# Patient Record
Sex: Female | Born: 1955 | Race: White | Hispanic: No | Marital: Single | Smoking: Former smoker
Health system: Southern US, Community
[De-identification: ages and names within clinical notes are randomized; demographics above are authoritative.]

## PROBLEM LIST (undated history)

## (undated) DIAGNOSIS — G629 Polyneuropathy, unspecified: Secondary | ICD-10-CM

## (undated) DIAGNOSIS — E7800 Pure hypercholesterolemia, unspecified: Secondary | ICD-10-CM

## (undated) DIAGNOSIS — K219 Gastro-esophageal reflux disease without esophagitis: Secondary | ICD-10-CM

## (undated) DIAGNOSIS — M069 Rheumatoid arthritis, unspecified: Secondary | ICD-10-CM

## (undated) DIAGNOSIS — M549 Dorsalgia, unspecified: Secondary | ICD-10-CM

## (undated) DIAGNOSIS — R011 Cardiac murmur, unspecified: Secondary | ICD-10-CM

## (undated) DIAGNOSIS — J302 Other seasonal allergic rhinitis: Secondary | ICD-10-CM

## (undated) HISTORY — PX: OTHER SURGICAL HISTORY: SHX169

## (undated) HISTORY — DX: Rheumatoid arthritis, unspecified: M06.9

## (undated) HISTORY — DX: Dorsalgia, unspecified: M54.9

## (undated) HISTORY — PX: TONSILLECTOMY: SUR1361

## (undated) HISTORY — PX: TUBAL LIGATION: SHX77

## (undated) HISTORY — PX: KNEE SURGERY: SHX244

## (undated) HISTORY — DX: Polyneuropathy, unspecified: G62.9

---

## 2000-11-12 ENCOUNTER — Emergency Department (HOSPITAL_COMMUNITY): Admission: EM | Admit: 2000-11-12 | Discharge: 2000-11-12 | Payer: Self-pay | Admitting: *Deleted

## 2001-04-29 ENCOUNTER — Ambulatory Visit (HOSPITAL_COMMUNITY): Admission: RE | Admit: 2001-04-29 | Discharge: 2001-04-29 | Payer: Self-pay | Admitting: Obstetrics and Gynecology

## 2001-04-29 ENCOUNTER — Encounter: Payer: Self-pay | Admitting: Obstetrics and Gynecology

## 2001-05-13 ENCOUNTER — Ambulatory Visit (HOSPITAL_COMMUNITY): Admission: RE | Admit: 2001-05-13 | Discharge: 2001-05-13 | Payer: Self-pay | Admitting: Obstetrics and Gynecology

## 2001-05-13 ENCOUNTER — Encounter: Payer: Self-pay | Admitting: Obstetrics and Gynecology

## 2004-01-02 ENCOUNTER — Ambulatory Visit (HOSPITAL_COMMUNITY): Admission: RE | Admit: 2004-01-02 | Discharge: 2004-01-02 | Payer: Self-pay | Admitting: Preventative Medicine

## 2004-03-01 ENCOUNTER — Emergency Department (HOSPITAL_COMMUNITY): Admission: EM | Admit: 2004-03-01 | Discharge: 2004-03-01 | Payer: Self-pay | Admitting: Emergency Medicine

## 2004-03-03 ENCOUNTER — Emergency Department (HOSPITAL_COMMUNITY): Admission: EM | Admit: 2004-03-03 | Discharge: 2004-03-03 | Payer: Self-pay | Admitting: Emergency Medicine

## 2004-03-05 ENCOUNTER — Emergency Department (HOSPITAL_COMMUNITY): Admission: EM | Admit: 2004-03-05 | Discharge: 2004-03-05 | Payer: Self-pay | Admitting: Emergency Medicine

## 2004-03-31 ENCOUNTER — Emergency Department (HOSPITAL_COMMUNITY): Admission: EM | Admit: 2004-03-31 | Discharge: 2004-03-31 | Payer: Self-pay | Admitting: Emergency Medicine

## 2005-01-31 ENCOUNTER — Emergency Department (HOSPITAL_COMMUNITY): Admission: EM | Admit: 2005-01-31 | Discharge: 2005-01-31 | Payer: Self-pay | Admitting: Emergency Medicine

## 2005-02-01 ENCOUNTER — Emergency Department (HOSPITAL_COMMUNITY): Admission: EM | Admit: 2005-02-01 | Discharge: 2005-02-01 | Payer: Self-pay | Admitting: Emergency Medicine

## 2005-09-20 ENCOUNTER — Emergency Department (HOSPITAL_COMMUNITY): Admission: EM | Admit: 2005-09-20 | Discharge: 2005-09-20 | Payer: Self-pay | Admitting: Emergency Medicine

## 2005-10-14 ENCOUNTER — Ambulatory Visit (HOSPITAL_COMMUNITY): Admission: RE | Admit: 2005-10-14 | Discharge: 2005-10-14 | Payer: Self-pay | Admitting: Orthopaedic Surgery

## 2005-11-02 ENCOUNTER — Ambulatory Visit: Payer: Self-pay | Admitting: Orthopedic Surgery

## 2005-11-05 ENCOUNTER — Encounter (HOSPITAL_COMMUNITY): Admission: RE | Admit: 2005-11-05 | Discharge: 2005-11-14 | Payer: Self-pay | Admitting: Orthopedic Surgery

## 2005-11-16 ENCOUNTER — Encounter (HOSPITAL_COMMUNITY): Admission: RE | Admit: 2005-11-16 | Discharge: 2005-12-16 | Payer: Self-pay | Admitting: Orthopedic Surgery

## 2005-12-22 ENCOUNTER — Encounter (HOSPITAL_COMMUNITY): Admission: RE | Admit: 2005-12-22 | Discharge: 2006-01-21 | Payer: Self-pay | Admitting: Orthopedic Surgery

## 2006-01-04 ENCOUNTER — Ambulatory Visit: Payer: Self-pay | Admitting: Orthopedic Surgery

## 2006-02-01 ENCOUNTER — Ambulatory Visit: Payer: Self-pay | Admitting: Orthopedic Surgery

## 2006-03-15 ENCOUNTER — Ambulatory Visit: Payer: Self-pay | Admitting: Orthopedic Surgery

## 2006-06-14 ENCOUNTER — Ambulatory Visit: Payer: Self-pay | Admitting: Orthopedic Surgery

## 2006-10-21 ENCOUNTER — Ambulatory Visit (HOSPITAL_COMMUNITY): Admission: RE | Admit: 2006-10-21 | Discharge: 2006-10-21 | Payer: Self-pay | Admitting: Orthopedic Surgery

## 2006-11-03 ENCOUNTER — Ambulatory Visit (HOSPITAL_COMMUNITY): Admission: RE | Admit: 2006-11-03 | Discharge: 2006-11-03 | Payer: Self-pay | Admitting: Family Medicine

## 2006-12-06 ENCOUNTER — Encounter (HOSPITAL_COMMUNITY): Admission: RE | Admit: 2006-12-06 | Discharge: 2007-01-05 | Payer: Self-pay | Admitting: Orthopedic Surgery

## 2007-01-06 ENCOUNTER — Encounter (HOSPITAL_COMMUNITY): Admission: RE | Admit: 2007-01-06 | Discharge: 2007-02-05 | Payer: Self-pay | Admitting: Orthopedic Surgery

## 2007-02-14 ENCOUNTER — Ambulatory Visit (HOSPITAL_COMMUNITY): Admission: RE | Admit: 2007-02-14 | Discharge: 2007-02-14 | Payer: Self-pay | Admitting: Orthopedic Surgery

## 2007-02-25 ENCOUNTER — Encounter (HOSPITAL_COMMUNITY): Admission: RE | Admit: 2007-02-25 | Discharge: 2007-03-27 | Payer: Self-pay | Admitting: Orthopedic Surgery

## 2007-04-01 ENCOUNTER — Ambulatory Visit: Admission: RE | Admit: 2007-04-01 | Discharge: 2007-04-01 | Payer: Self-pay | Admitting: Orthopedic Surgery

## 2007-06-16 ENCOUNTER — Emergency Department (HOSPITAL_COMMUNITY): Admission: EM | Admit: 2007-06-16 | Discharge: 2007-06-16 | Payer: Self-pay | Admitting: Emergency Medicine

## 2007-08-22 ENCOUNTER — Ambulatory Visit (HOSPITAL_COMMUNITY): Admission: RE | Admit: 2007-08-22 | Discharge: 2007-08-22 | Payer: Self-pay | Admitting: Family Medicine

## 2008-05-14 ENCOUNTER — Encounter: Payer: Self-pay | Admitting: Gastroenterology

## 2008-07-02 ENCOUNTER — Telehealth (INDEPENDENT_AMBULATORY_CARE_PROVIDER_SITE_OTHER): Payer: Self-pay | Admitting: *Deleted

## 2008-08-24 ENCOUNTER — Ambulatory Visit (HOSPITAL_COMMUNITY): Admission: RE | Admit: 2008-08-24 | Discharge: 2008-08-24 | Payer: Self-pay | Admitting: Family Medicine

## 2008-09-14 DIAGNOSIS — I498 Other specified cardiac arrhythmias: Secondary | ICD-10-CM

## 2008-09-14 DIAGNOSIS — I10 Essential (primary) hypertension: Secondary | ICD-10-CM | POA: Insufficient documentation

## 2008-10-02 ENCOUNTER — Emergency Department (HOSPITAL_COMMUNITY): Admission: EM | Admit: 2008-10-02 | Discharge: 2008-10-02 | Payer: Self-pay | Admitting: Emergency Medicine

## 2009-08-26 ENCOUNTER — Ambulatory Visit (HOSPITAL_COMMUNITY): Admission: RE | Admit: 2009-08-26 | Discharge: 2009-08-26 | Payer: Self-pay | Admitting: Family Medicine

## 2010-01-24 ENCOUNTER — Ambulatory Visit (HOSPITAL_COMMUNITY)
Admission: RE | Admit: 2010-01-24 | Discharge: 2010-01-24 | Payer: Self-pay | Source: Home / Self Care | Attending: Family Medicine | Admitting: Family Medicine

## 2010-03-14 ENCOUNTER — Encounter
Admission: RE | Admit: 2010-03-14 | Discharge: 2010-03-14 | Payer: Self-pay | Source: Home / Self Care | Attending: Family Medicine | Admitting: Family Medicine

## 2010-04-25 ENCOUNTER — Other Ambulatory Visit: Payer: Self-pay | Admitting: Family Medicine

## 2010-04-25 DIAGNOSIS — M4716 Other spondylosis with myelopathy, lumbar region: Secondary | ICD-10-CM

## 2010-04-29 ENCOUNTER — Ambulatory Visit
Admission: RE | Admit: 2010-04-29 | Discharge: 2010-04-29 | Disposition: A | Discharge status: Home / Self Care | Payer: Medicare Other | Source: Ambulatory Visit | Attending: Family Medicine | Admitting: Family Medicine

## 2010-04-29 ENCOUNTER — Other Ambulatory Visit: Payer: Self-pay | Admitting: Family Medicine

## 2010-04-29 DIAGNOSIS — M4716 Other spondylosis with myelopathy, lumbar region: Secondary | ICD-10-CM

## 2010-05-20 ENCOUNTER — Other Ambulatory Visit: Payer: Medicare Other

## 2010-06-03 ENCOUNTER — Ambulatory Visit
Admission: RE | Admit: 2010-06-03 | Discharge: 2010-06-03 | Disposition: A | Discharge status: Home / Self Care | Payer: Medicare Other | Source: Ambulatory Visit | Attending: Family Medicine | Admitting: Family Medicine

## 2010-06-03 DIAGNOSIS — M4716 Other spondylosis with myelopathy, lumbar region: Secondary | ICD-10-CM

## 2010-07-01 NOTE — Op Note (Signed)
Mackenzie Black, Mackenzie Black              ACCOUNT NO.:  [id number]   MEDICAL RECORD NO.:  [id number]          PATIENT TYPE:  AMB   LOCATION:  SDS                          FACILITY:  [facility]   PHYSICIAN:  Vania Rea. Supple, M.D.  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   POSTOPERATIVE DIAGNOSES:  1. 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  2. 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   PROCEDURES:  1. Right knee examination under anesthesia.  2. Right knee diagnostic arthroscopy.  3. Autograft anterior cruciate ligament reconstruction the patellar      tendon bone autograft.  4. Chondroplasty of the medial femoral condyle and lateral tibial      plateaus.   SURGEON:  Vania Rea. Supple, M.D.   Threasa HeadsFrench Ana A. Shuford, P.A.-C.   ANESTHESIA:  General endotracheal as well as a femoral nerve block.   TOURNIQUET TIME:  Less than 90 minutes.   ESTIMATED BLOOD LOSS:  Minimal.   DRAINS:  None.   HISTORY:  [name] is a 55 year old female who has had chronic right  knee pain and symptomatic instability secondary to an ACL insufficiency.  Clinical exam as well as MRI scan confirm ACL insufficiency.  Due to her  ongoing pain and functional limitations, she is brought to the operating  room at this time for a planned ACL reconstruction.   Preoperatively counseled [name] on treatment options as well as  risks versus benefits thereof.  Possible surgical complications  including infection, neurovascular injury, DVT, PE arthrofibrosis,  persistence of pain, loss of motion, recurrence of instability and  possible need for additional surgery were all reviewed.  She understands  and accepts and agrees with our  planned procedure.   PROCEDURE IN DETAIL:  After undergoing routine preoperative evaluation,  patient received prophylactic antibiotics.  A femoral nerve block was  established in the holding area by the anesthesia department, placed  supine on the operative table and underwent smooth induction of a  general endotracheal anesthesia.  Right leg examination under anesthesia  revealed positive Lachman, positive pivot shift, no varus or valgus  laxity, no posterior instability.  A tourniquet applied to the right  thigh and the right leg was then sterilely prepped and draped in  standard fashion.  The leg was exsanguinated with a tourniquet inflated  to 350 mmHg.  An anterior midline incision was then made from the  inferior pole of the patella to just medial to the tibial tubercle,  total length approximately 6 cm.  A skin flap was circumferentially  mobilized and then the paratenon was divided in the midline and the  central one-third of the infrapatellar tendon was harvested with a  double bladed 10 mm wide knife and then bone plugs were harvested from  the corresponding aspects of the patella and tibial tubercle utilizing  an oscillating saw.  The  graft was taken to the back table and fashion  fit to 10-mm tunnels.   At this point, standard arthroscopy portals were established and  diagnostic arthroscopy was performed.  The suprapatellar pouch and  gutters were clear.  The patellofemoral joint showed normal patellar  tracking and the articular surfaces were in excellent condition.  The  intercondylar notch confirmed previous rupture of the ACL with a  positive MT notch sign.  The remnant to the ACL had scarred to the  PCL.  This was dissected free and all the remnants of the ACL were then  removed.  Medially, there was broad grade 3 chondromalacia on the medial  femoral condyle, which was debrided to a stable cartilaginous base with  the shaver.  The medial meniscus was carefully probed,  inspected and  found to be stable.  Laterally, the meniscus was probed and stable.  There was diffuse grade 1 softening of the lateral tibial plateau, which  was also debrided with the shaver.   Attention turned to the intercondylar notch where an osteotome was used  to initiate a notch plasty and this was then completed back to the old  position with a bur.  All residual bony debris was meticulously removed.  A tibial guide was then placed and the guide pin brought up through the  footprint of the ACL from the proximal medial tibia and this was then  overdrilled with the 10-mm reamer.  The femoral guide was then passed.  A guide pin placed up into the distal femur at the 10:30 position and  this was then overdrilled with the reamer.  Of note, at this point, we  did find that the bone was extremely osteoporotic and the reamers  readily passed through the bone with very little resistance.   At this point, the tunnel was then cleaned.  The residual bony debris  was meticulously removed.  A two-pin passer was then directed to the  tibia and then the femoral tunnels and brought out through a stab wound  on the anterolateral thigh.  A flexible guide wire was then passed and  then the graft was pulled into position.  Good interference fit was  achieved proximally and distally.  We initially placed an 8 x 25 mm  biointerference screw into the femoral tunnel.  There was very little  resistance encountered as the screw was placed.  We took the knee  through range of motion and there was physiometric positioning of the  graft with no evidence for impingement.  We then placed an initial  tibial tunnel 9 mm x 25 mm biointerference screw.   At this point, we performed intraoperative Lachman and pivot shift and  the knee felt unstable.  We then reexamined the joint arthroscopically  and did see excess laxity in the graft.  We went ahead and removed the  tibial screw.  We then pulled on the graft and  the femoral bone plug  really dislodged from the femoral tunnel.  With this finding, we went  ahead and removed the initial 8 x 25 BioScrew from the femoral tunnel.  We repassed the graft and then we placed a guide wire, then initially  placed a 10 x 25 mm biointerference screw up into the femoral tunnel and  this again had very unimpressive bony purchase.  I then performed a  double stack, placing an additional 7-mm screw up into the femoral  tunnel.  This finally did obtain some fair bony purchase.  Once  this was  completed, then tensioning of the graft showed good stability.  We then  placed a combination of an 8 and a 9-mm BioScrew into the tibial tunnel,  again, in a double stack technique and, again, this did obtain good  purchase and stable fixation.  At this point, the intraoperative Lachman  was negative.  Graft was then inspected and probed and found to have  good tension and good position.  All fluid and instruments were then  removed.   The anterior incision was closed with a running 2-0 Vicryl through the  paratenon area, 2-0 Vicryl for the subcutaneous and intracuticular, 3-0  Monocryl for the skin, followed by Steri-Strips.  Proximal portals  closed with Steri-Strips.  A combination of Marcaine, morphine,  epinephrine and clonidine was instilled into the joint.  Additional Marcaine with epinephrine was instilled about the portals and  incision.  Bulky dry dressing was applied, followed by an Ace wrap,  thigh high sports stocking, knee immobilizer.  The tourniquet was then  let down and the patient was extubated and taken to the recovery room in  stable condition.      Vania Rea. Supple, M.D.  Electronically Signed     KMS/MEDQ  D:  10/21/2006  T:  10/22/2006  Job:  [id number]

## 2010-08-21 ENCOUNTER — Other Ambulatory Visit (HOSPITAL_COMMUNITY): Payer: Self-pay | Admitting: Family Medicine

## 2010-08-21 DIAGNOSIS — Z139 Encounter for screening, unspecified: Secondary | ICD-10-CM

## 2010-09-12 ENCOUNTER — Ambulatory Visit (HOSPITAL_COMMUNITY)
Admission: RE | Admit: 2010-09-12 | Discharge: 2010-09-12 | Disposition: A | Discharge status: Home / Self Care | Payer: Medicare Other | Source: Ambulatory Visit | Attending: Family Medicine | Admitting: Family Medicine

## 2010-09-12 DIAGNOSIS — Z1231 Encounter for screening mammogram for malignant neoplasm of breast: Secondary | ICD-10-CM | POA: Insufficient documentation

## 2010-09-12 DIAGNOSIS — Z139 Encounter for screening, unspecified: Secondary | ICD-10-CM

## 2010-09-22 ENCOUNTER — Other Ambulatory Visit: Payer: Self-pay | Admitting: Family Medicine

## 2010-09-22 DIAGNOSIS — M47816 Spondylosis without myelopathy or radiculopathy, lumbar region: Secondary | ICD-10-CM

## 2010-09-24 ENCOUNTER — Ambulatory Visit
Admission: RE | Admit: 2010-09-24 | Discharge: 2010-09-24 | Disposition: A | Discharge status: Home / Self Care | Payer: Medicare Other | Source: Ambulatory Visit | Attending: Family Medicine | Admitting: Family Medicine

## 2010-09-24 VITALS — BP 118/70 | HR 64

## 2010-09-24 DIAGNOSIS — M47816 Spondylosis without myelopathy or radiculopathy, lumbar region: Secondary | ICD-10-CM

## 2010-09-24 MED ORDER — METHYLPREDNISOLONE ACETATE 40 MG/ML INJ SUSP (RADIOLOG
120.0000 mg | Freq: Once | INTRAMUSCULAR | Status: AC
  Administered 2010-09-24: 120 mg via EPIDURAL

## 2010-09-24 MED ORDER — IOHEXOL 180 MG/ML  SOLN
1.0000 mL | Freq: Once | INTRAMUSCULAR | Status: AC | PRN
  Administered 2010-09-24: 1 mL via EPIDURAL

## 2010-09-24 NOTE — Progress Notes (Signed)
Patient with significant numbness LLE.  Will keep in recovery until better able to bear weight.  jkl

## 2010-11-11 LAB — DIFFERENTIAL
Eosinophils Absolute: 0.1
Eosinophils Relative: 1
Lymphs Abs: 2
Monocytes Absolute: 0.7
Monocytes Relative: 7

## 2010-11-11 LAB — CBC
HCT: 42.3
MCV: 89
RBC: 4.75
WBC: 9.9

## 2010-11-11 LAB — BASIC METABOLIC PANEL
CO2: 27
Chloride: 101
GFR calc Af Amer: >60
Potassium: 4.7

## 2010-11-28 LAB — CBC
MCHC: 33.7
MCV: 90
Platelets: 319
RDW: 12.6

## 2010-11-28 LAB — COMPREHENSIVE METABOLIC PANEL
AST: 30
Albumin: 4.4
CO2: 26
Calcium: 9.9
Creatinine, Ser: 0.62
GFR calc Af Amer: >60
GFR calc non Af Amer: >60

## 2010-11-28 LAB — URINALYSIS, ROUTINE W REFLEX MICROSCOPIC
Glucose, UA: NEGATIVE
Nitrite: NEGATIVE
Protein, ur: NEGATIVE
Urobilinogen, UA: 0.2

## 2010-11-28 LAB — URINE MICROSCOPIC-ADD ON

## 2010-11-28 LAB — PROTIME-INR: Prothrombin Time: 12.5

## 2010-11-28 LAB — DIFFERENTIAL
Eosinophils Relative: 1
Lymphocytes Relative: 19
Lymphs Abs: 2.2

## 2011-03-24 NOTE — H&P (Signed)
  NTS SOAP Note  Vital Signs:  Vitals as of: 03/24/2011: Systolic 154: Diastolic 92: Heart Rate 63: Temp 97.70F: Height 73ft 7in: Weight 154Lbs 0 Ounces: OFC 0in: Respiratory Rate 0: O2 Saturation 0: Pain Level 0: BMI 24  BMI : 24.12 kg/m2  Subjective: This 56 Years 76 Months old Female presents forscheduling colonoscopy.  Never has had a colonoscopy.  Denies GI complaints.  Sister and brother have colon cancer.  Review of Symptoms:  Constitutional:unremarkable Head:unremarkable Eyes:unremarkable Nose/Mouth/Throat:unremarkable Cardiovascular:unremarkable Respiratory:unremarkable Gastrointestinal:unremarkable Genitourinary:unremarkable Musculoskeletal:unremarkable Skin:unremarkable Hematolgic/Lymphatic:unremarkable Allergic/Immunologic:unremarkable   Past Medical History:Reviewed   Past Medical History  Surgical History: feet surgery Psychiatric History:  Depression Allergies: nkda Medications: loratadine, bupropion, gabapentin   Social History:Reviewed   Social History  Preferred Language: English ([place]) Race:  White Ethnicity: Not Hispanic / Latino Age: 56 Years 7 Months Marital Status:  S Alcohol:  No Recreational drug(s):  No   Smoking Status: Never smoker reviewed on 03/24/2011  Family History:Reviewed   Family History  Family h/o colon cancer in brother, sister    Objective Information: General:Well appearing, well nourished in no distress. Head:Atraumatic; no masses; no abnormalities Neck:Supple without lymphadenopathy.  Heart:RRR, no murmur or gallop.  Normal S1, S2.  No S3, S4.  Lungs:CTA bilaterally, no wheezes, rhonchi, rales.  Breathing unlabored. Abdomen:Soft, NT/ND, no HSM, no masses. deferred to procedure  Assessment:Family h/o colon cancer  Diagnosis &amp; Procedure: DiagnosisCode: V76.51, ProcedureCode: 16109,    Plan:Scheduled for TCS on  03/31/11.   Patient Education:Alternative treatments to surgery were discussed with patient (and family).Risks and benefits  of procedure were fully explained to the patient (and family) who gave informed consent. Patient/family questions were addressed.  Follow-up:Pending Surgery

## 2011-03-30 MED ORDER — SODIUM CHLORIDE 0.45 % IV SOLN
Freq: Once | INTRAVENOUS | Status: AC
  Administered 2011-03-31: 09:00:00 via INTRAVENOUS

## 2011-03-31 ENCOUNTER — Ambulatory Visit (HOSPITAL_COMMUNITY)
Admission: RE | Admit: 2011-03-31 | Discharge: 2011-03-31 | Disposition: A | Discharge status: Home / Self Care | Payer: Medicare Other | Source: Ambulatory Visit | Attending: General Surgery | Admitting: General Surgery

## 2011-03-31 ENCOUNTER — Encounter (HOSPITAL_COMMUNITY)
Admission: RE | Disposition: A | Discharge status: Home / Self Care | Payer: Self-pay | Source: Ambulatory Visit | Attending: General Surgery

## 2011-03-31 ENCOUNTER — Encounter (HOSPITAL_COMMUNITY): Payer: Self-pay | Admitting: *Deleted

## 2011-03-31 DIAGNOSIS — Z1211 Encounter for screening for malignant neoplasm of colon: Secondary | ICD-10-CM | POA: Insufficient documentation

## 2011-03-31 HISTORY — PX: COLONOSCOPY: SHX5424

## 2011-03-31 SURGERY — COLONOSCOPY
Anesthesia: Moderate Sedation

## 2011-03-31 MED ORDER — MIDAZOLAM HCL 5 MG/5ML IJ SOLN
INTRAMUSCULAR | Status: DC | PRN
  Administered 2011-03-31: 3 mg via INTRAVENOUS
  Administered 2011-03-31 (×2): 1 mg via INTRAVENOUS

## 2011-03-31 MED ORDER — MIDAZOLAM HCL 5 MG/5ML IJ SOLN
INTRAMUSCULAR | Status: AC
  Filled 2011-03-31: qty 5

## 2011-03-31 MED ORDER — MEPERIDINE HCL 50 MG/ML IJ SOLN
INTRAMUSCULAR | Status: AC
  Filled 2011-03-31: qty 2

## 2011-03-31 MED ORDER — MEPERIDINE HCL 25 MG/ML IJ SOLN
INTRAMUSCULAR | Status: DC | PRN
  Administered 2011-03-31: 50 mg via INTRAVENOUS

## 2011-03-31 MED ORDER — STERILE WATER FOR IRRIGATION IR SOLN
Status: DC | PRN
  Administered 2011-03-31: 09:00:00

## 2011-03-31 NOTE — Op Note (Signed)
[facility] [address] [place], [place]  [address]  COLONOSCOPY PROCEDURE REPORT  PATIENT:  Mackenzie Black, Mackenzie Black  MR#:  [id number] BIRTHDATE:  [birth year], 55 yrs. old  GENDER:  female ENDOSCOPIST:  Franky Macho, MD REF. BY:  Gareth Morgan, M.D. PROCEDURE DATE:  03/31/2011 PROCEDURE:  Average-risk screening colonoscopy G0121 ASA CLASS:  Class II INDICATIONS:  Screening MEDICATIONS:   Versed 5 mg IV, demerol 50 mg IV  DESCRIPTION OF PROCEDURE:   After the risks benefits and alternatives of the procedure were thoroughly explained, informed consent was obtained.  Digital rectal exam was performed and revealed no abnormalities.   The EC-[id number] ([id number]) endoscope was introduced through the anus and advanced to the cecum, which was identified by both the appendix and ileocecal valve, without limitations.  The quality of the prep was fair..  The instrument was then slowly withdrawn as the colon was fully examined.  FINDINGS:  Tortuous sigmoid colon noted.  A normal appearing cecum, ileocecal valve, and appendiceal orifice were identified. The ascending, hepatic flexure, transverse, splenic flexure, descending, sigmoid colon, and rectum appeared unremarkable. Retroflexed views in the rectum revealed it was not tolerated by the patient.  The scope was then withdrawn from the cecum and the procedure completed. COMPLICATIONS:  None ENDOSCOPIC IMPRESSION: 1) Normal colon  RECOMMENDATIONS:  REPEAT EXAM:  In 10 year(s) for Colonoscopy.  ______________________________ Franky Macho, MD  CC:  Gareth Morgan MD  n. Rosalie DoctorFranky Macho at 03/31/2011 09:33 AM  Maggie Font, [phone]

## 2011-03-31 NOTE — Interval H&P Note (Signed)
History and Physical Interval Note:  03/31/2011 8:56 AM  Mackenzie Black  has presented today for surgery, with the diagnosis of Special screening for malignant neoplasms, colon  The various methods of treatment have been discussed with the patient and family. After consideration of risks, benefits and other options for treatment, the patient has consented to  Procedure(s) (LRB): COLONOSCOPY (N/A) as a surgical intervention .  The patients' history has been reviewed, patient examined, no change in status, stable for surgery.  I have reviewed the patients' chart and labs.  Questions were answered to the patient's satisfaction.     Franky Macho A

## 2011-03-31 NOTE — Discharge Instructions (Signed)
Colonoscopy  Care After  Read the instructions outlined below and refer to this sheet in the next few weeks. These discharge instructions provide you with general information on caring for yourself after you leave the hospital. Your doctor may also give you specific instructions. While your treatment has been planned according to the most current medical practices available, unavoidable complications occasionally occur. If you have any problems or questions after discharge, call your doctor.  HOME CARE INSTRUCTIONS  ACTIVITY:  · You may resume your regular activity, but move at a slower pace for the next 24 hours.   · Take frequent rest periods for the next 24 hours.   · Walking will help get rid of the air and reduce the bloated feeling in your belly (abdomen).   · No driving for 24 hours (because of the medicine (anesthesia) used during the test).   · You may shower.   · Do not sign any important legal documents or operate any machinery for 24 hours (because of the anesthesia used during the test).   NUTRITION:  · Drink plenty of fluids.   · You may resume your normal diet as instructed by your doctor.   · Begin with a light meal and progress to your normal diet. Heavy or fried foods are harder to digest and may make you feel sick to your stomach (nauseated).   · Avoid alcoholic beverages for 24 hours or as instructed.   MEDICATIONS:  · You may resume your normal medications unless your doctor tells you otherwise.   WHAT TO EXPECT TODAY:  · Some feelings of bloating in the abdomen.   · Passage of more gas than usual.   · Spotting of blood in your stool or on the toilet paper.   IF YOU HAD POLYPS REMOVED DURING THE COLONOSCOPY:  · No aspirin products for 7 days or as instructed.   · No alcohol for 7 days or as instructed.   · Eat a soft diet for the next 24 hours.   FINDING OUT THE RESULTS OF YOUR TEST  Not all test results are available during your visit. If your test results are not back during the visit, make an  appointment with your caregiver to find out the results. Do not assume everything is normal if you have not heard from your caregiver or the medical facility. It is important for you to follow up on all of your test results.   SEEK IMMEDIATE MEDICAL CARE IF:  · You have more than a spotting of blood in your stool.   · Your belly is swollen (abdominal distention).   · You are nauseated or vomiting.   · You have a fever.   · You have abdominal pain or discomfort that is severe or gets worse throughout the day.   Document Released: 09/17/2003 Document Revised: 10/15/2010 Document Reviewed: 09/15/2007  ExitCare® Patient Information ©2012 [employer].

## 2011-04-06 ENCOUNTER — Encounter (HOSPITAL_COMMUNITY): Payer: Self-pay | Admitting: General Surgery

## 2011-05-11 ENCOUNTER — Other Ambulatory Visit: Payer: Self-pay | Admitting: Neurosurgery

## 2011-05-11 DIAGNOSIS — M79606 Pain in leg, unspecified: Secondary | ICD-10-CM

## 2011-05-11 DIAGNOSIS — M545 Low back pain: Secondary | ICD-10-CM

## 2011-05-11 DIAGNOSIS — M47816 Spondylosis without myelopathy or radiculopathy, lumbar region: Secondary | ICD-10-CM

## 2011-05-19 ENCOUNTER — Other Ambulatory Visit: Payer: Medicare Other

## 2011-05-26 ENCOUNTER — Ambulatory Visit
Admission: RE | Admit: 2011-05-26 | Discharge: 2011-05-26 | Disposition: A | Discharge status: Home / Self Care | Payer: Medicare Other | Source: Ambulatory Visit | Attending: Neurosurgery | Admitting: Neurosurgery

## 2011-05-26 DIAGNOSIS — M47816 Spondylosis without myelopathy or radiculopathy, lumbar region: Secondary | ICD-10-CM

## 2011-05-26 DIAGNOSIS — M79606 Pain in leg, unspecified: Secondary | ICD-10-CM

## 2011-05-26 DIAGNOSIS — M545 Low back pain: Secondary | ICD-10-CM

## 2011-06-15 ENCOUNTER — Ambulatory Visit (HOSPITAL_COMMUNITY)
Admission: RE | Admit: 2011-06-15 | Discharge: 2011-06-15 | Disposition: A | Discharge status: Home / Self Care | Payer: Medicare Other | Source: Ambulatory Visit | Attending: Neurosurgery | Admitting: Neurosurgery

## 2011-06-15 DIAGNOSIS — M545 Low back pain, unspecified: Secondary | ICD-10-CM | POA: Insufficient documentation

## 2011-06-15 DIAGNOSIS — M549 Dorsalgia, unspecified: Secondary | ICD-10-CM | POA: Insufficient documentation

## 2011-06-15 DIAGNOSIS — IMO0001 Reserved for inherently not codable concepts without codable children: Secondary | ICD-10-CM | POA: Insufficient documentation

## 2011-06-15 DIAGNOSIS — M6281 Muscle weakness (generalized): Secondary | ICD-10-CM | POA: Insufficient documentation

## 2011-06-15 DIAGNOSIS — M47816 Spondylosis without myelopathy or radiculopathy, lumbar region: Secondary | ICD-10-CM | POA: Insufficient documentation
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
## 2011-06-17 ENCOUNTER — Inpatient Hospital Stay (HOSPITAL_COMMUNITY): Admission: RE | Admit: 2011-06-17 | Payer: Medicare Other | Source: Ambulatory Visit | Admitting: Physical Therapy

## 2011-06-17 ENCOUNTER — Telehealth (HOSPITAL_COMMUNITY): Payer: Self-pay

## 2011-06-18 ENCOUNTER — Ambulatory Visit (HOSPITAL_COMMUNITY)
Admission: RE | Admit: 2011-06-18 | Discharge: 2011-06-18 | Disposition: A | Discharge status: Home / Self Care | Payer: Medicare Other | Source: Ambulatory Visit | Attending: Neurosurgery | Admitting: Neurosurgery

## 2011-06-18 ENCOUNTER — Ambulatory Visit (HOSPITAL_COMMUNITY)
Admission: RE | Admit: 2011-06-18 | Discharge: 2011-06-18 | Disposition: A | Discharge status: Home / Self Care | Payer: Medicare Other | Source: Ambulatory Visit | Attending: Family Medicine | Admitting: Family Medicine

## 2011-06-18 ENCOUNTER — Other Ambulatory Visit (HOSPITAL_COMMUNITY): Payer: Self-pay | Admitting: Family Medicine

## 2011-06-18 DIAGNOSIS — I10 Essential (primary) hypertension: Secondary | ICD-10-CM | POA: Insufficient documentation

## 2011-06-18 DIAGNOSIS — R011 Cardiac murmur, unspecified: Secondary | ICD-10-CM

## 2011-06-18 DIAGNOSIS — I059 Rheumatic mitral valve disease, unspecified: Secondary | ICD-10-CM

## 2011-06-18 NOTE — Progress Notes (Signed)
*  PRELIMINARY RESULTS* Echocardiogram 2D Echocardiogram has been performed.  Conrad Pipestone 06/18/2011, 11:51 AM

## 2011-06-19 ENCOUNTER — Ambulatory Visit (HOSPITAL_COMMUNITY): Payer: Medicare Other | Admitting: Physical Therapy

## 2011-06-22 ENCOUNTER — Ambulatory Visit (HOSPITAL_COMMUNITY)
Admission: RE | Admit: 2011-06-22 | Discharge: 2011-06-22 | Disposition: A | Discharge status: Home / Self Care | Payer: Medicare Other | Source: Ambulatory Visit | Attending: Family Medicine | Admitting: Family Medicine

## 2011-06-22 DIAGNOSIS — IMO0001 Reserved for inherently not codable concepts without codable children: Secondary | ICD-10-CM | POA: Insufficient documentation

## 2011-06-22 DIAGNOSIS — M545 Low back pain, unspecified: Secondary | ICD-10-CM | POA: Insufficient documentation

## 2011-06-22 DIAGNOSIS — M6281 Muscle weakness (generalized): Secondary | ICD-10-CM | POA: Insufficient documentation

## 2011-06-22 NOTE — Progress Notes (Signed)
Physical Therapy Treatment Patient Details  Name: Mackenzie Black MRN: [id number] Date of Birth: 08/01/1955  Today's Date: 06/22/2011 Time: 1302-1345 PT Time Calculation (min): 43 min Charges: 65' NMR Visit#: 2  of 12   Re-eval: 07/15/11    Authorization: MEDICARE  Authorization Time Period: G Code: CL, Goal: CJ  Authorization Visit#: 2  of 10    Subjective: Symptoms/Limitations Symptoms: Pt reports that her allergies have started to act up the last few days and she was unable to complete her exercises.  Pain Assessment Currently in Pain?: Yes Pain Score:   5 Pain Location: Back  Exercise/Treatments Supine Ab Set: Limitations AB Set Limitations: NMR using TC's, VC's and visual cueing to activate.  Able to activate L side 2x10 sec hold after NMR Other Supine Lumbar Exercises: NMR to establish PF contraction using TC's, VC's and Visual cueing, unable to palpate appropriate TrA contraction Prone  Other Prone Lumbar Exercises: Diaphragmatic breathing w/NMR for proper techniques used throughout the session for TrA, PF and Mult contractions.  Other Prone Lumbar Exercises: NMR for multifidus activiation using VC's, TC's, Visual cueing and L arm lift for activiation 3x10 sec holds w/arm lift  Physical Therapy Assessment and Plan PT Assessment and Plan Clinical Impression Statement: Treatment focus on NMR to core musculature.  Continues to have significant substitution methods including holding breath and recruiting prime movers.  She has most success activating multifidus muscles w/arm lift cueing.  OVerall able to palpate signficant weakness to R: multifids and TrA  musculature.  Reports pain 3/10 at end of treatment PT Plan: Cont with NMR for core muscles, Add heel/toe roll in and outs as able, SKTC, bridging when able.     Goals    Problem List Patient Active Problem List  Diagnoses  . HYPERTENSION  . SUPRAVENTRICULAR TACHYCARDIA  . Lumbar spondylosis  . Back pain        GP No functional reporting required  Mackenzie Black 06/22/2011, 1:55 PM

## 2011-06-24 ENCOUNTER — Ambulatory Visit (HOSPITAL_COMMUNITY)
Admission: RE | Admit: 2011-06-24 | Discharge: 2011-06-24 | Disposition: A | Discharge status: Home / Self Care | Payer: Medicare Other | Source: Ambulatory Visit | Attending: Family Medicine | Admitting: Family Medicine

## 2011-06-24 NOTE — Progress Notes (Signed)
Physical Therapy Treatment Patient Details  Name: Mackenzie Black MRN: [id number] Date of Birth: 1955/10/28  Today's Date: 06/24/2011 Time: 1301-1359 PT Time Calculation (min): 58 min Charges: 1 e-stim, 30' NMR, 10 TE Visit#: 3  of 12   Re-eval: 07/15/11    Authorization: MEDICARE   Authorization Time Period: G Code: CL, Goal: CJ  Authorization Visit#: 3  of 10    Subjective: Symptoms/Limitations Symptoms: Pt reports that her back is feeling really good, it is her feet that are giving her the most trouble.  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  Pain Assessment Currently in Pain?: Yes Pain Score: 10-Worst pain ever Pain Location: Foot Pain Orientation: Right;Left Pain Type: Chronic pain  Precautions/Restrictions     Exercise/Treatments Stretches Single Knee to Chest Stretch: 5 reps;10 seconds (BLE) Lower Trunk Rotation: 5 reps;Other (comment) (5 sec holds each direction) Seated Other Seated Lumbar Exercises: Heel/Toe Roll ins and outs 5x10 sec holds w/cueing for PF contraction Supine Ab Set: Other (comment) (7 reps, 10 sec holds) AB Set Limitations: NMR to establish using Diaphragmatic breathing techniques Bridge: 10 reps;5 seconds Prone  Other Prone Lumbar Exercises: NMR to establish R multifidus activation using TC's and VC's, able to complete 4x10 sec after NMR; NMR to PF via Multifidus palpation 3x10 sec holds  Physical Therapy Assessment and Plan PT Assessment and Plan Clinical Impression Statement: Pt had significant improvement in NMR to core musculature today and required less TC's, VC's and subsitiution activities to activate Multifidus and TrA.  Continues to demonstrate increased weakness to her PF muscles, however had improved NM control to them.   She reports increased fatigue after treatment today to her core muscles.  PT Plan: Cont w/NMR to core muscles and progress as able.  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 HYPERTENSION  . SUPRAVENTRICULAR TACHYCARDIA  . Lumbar spondylosis  . Back pain       GP No functional reporting required  Mackenzie Black 06/24/2011, 1:48 PM

## 2011-06-25 ENCOUNTER — Ambulatory Visit (HOSPITAL_COMMUNITY)
Admission: RE | Admit: 2011-06-25 | Discharge: 2011-06-25 | Disposition: A | Discharge status: Home / Self Care | Payer: Medicare Other | Source: Ambulatory Visit | Attending: Family Medicine | Admitting: Family Medicine

## 2011-06-25 NOTE — Progress Notes (Signed)
Physical Therapy Treatment Patient Details  Name: Mackenzie Black MRN: [id number] Date of Birth: 12-26-1955  Today's Date: 06/25/2011 Time: 9147-8295 PT Time Calculation (min): 56 min Visit#: 4  of 12   Re-eval: 07/15/11 Charges: Therex x 33' Estim x 15'   Authorization: MEDICARE  Authorization Time Period: G Code: CL, Goal: CJ   Authorization Visit#: 4  of 10    Subjective: Symptoms/Limitations Symptoms: Pt states that she was pain free after last session until later in the evening. Pain Assessment Currently in Pain?: Yes Pain Score:   4 Pain Location: Back Pain Orientation: Right;Left;Lower   Exercise/Treatments Stretches Single Knee to Chest Stretch: 5 reps;10 seconds Lower Trunk Rotation: 5 reps;10 seconds Supine Ab Set: 10 reps;Other (comment) (10" holds) AB Set Limitations: NMR to establish using Diaphragmatic breathing techniques Bent Knee Raise: 5 reps Bridge: 10 reps;5 seconds  Modalities Modalities: Archivist Stimulation Location: Lumbar/scaral area Statistician Action: IFC Electrical Stimulation Parameters: IFC L12 x 15' Electrical Stimulation Goals: Pain  Physical Therapy Assessment and Plan PT Assessment and Plan Clinical Impression Statement: Pt presents with improved multifidus contraction. Pt requires multimodal cueing to stabilize core with bent knee raises. IFC completed again this session secondary to positive results last session. Pt reports pain decrease to 2/10 at end of session. PT Plan: Continue to progress core stability/ decrease pain per PT POC.     Problem List Patient Active Problem List  Diagnoses  . HYPERTENSION  . SUPRAVENTRICULAR TACHYCARDIA  . Lumbar spondylosis  . Back pain    PT - End of Session Activity Tolerance: Patient tolerated treatment well General Behavior During Session: [facility] for tasks performed Cognition: [facility] for tasks performed   Seth Bake,  PTA 06/25/2011, 3:43 PM

## 2011-06-26 ENCOUNTER — Ambulatory Visit (HOSPITAL_COMMUNITY): Payer: Medicare Other

## 2011-06-29 ENCOUNTER — Ambulatory Visit (HOSPITAL_COMMUNITY)
Admission: RE | Admit: 2011-06-29 | Discharge: 2011-06-29 | Disposition: A | Discharge status: Home / Self Care | Payer: Medicare Other | Source: Ambulatory Visit | Attending: Family Medicine | Admitting: Family Medicine

## 2011-06-29 NOTE — Progress Notes (Signed)
Physical Therapy Treatment Patient Details  Name: Mackenzie Black MRN: [id number] Date of Birth: 06-19-1955  Today's Date: 06/29/2011 Time: 4098-1191 PT Time Calculation (min): 42 min Visit#: 5  of 12   Re-eval: 07/15/11 Charges:  therex 25', IFES (estim unattended)  X 15'    Subjective: Symptoms/Limitations Symptoms: Pt. states her pain continues to decrease.   Pain Assessment Currently in Pain?: Yes Pain Score:   2 Pain Location: Back Pain Orientation: Right;Left;Lower   Exercise/Treatments Stretches Single Knee to Chest Stretch: 5 reps;10 seconds Lower Trunk Rotation: 5 reps;10 seconds Supine Ab Set: 15 reps Bent Knee Raise: 10 reps Bridge: 15 reps Sidelying Clam: 5 reps;Limitations Clam Limitations: 10 sec hold each Prone  Single Arm Raise: 5 reps Straight Leg Raise: 5 reps   Electrical Stimulation Electrical Stimulation Location: B Lumbar/sacral area Electrical Stimulation Action: IFES to decrease pain Electrical Stimulation Parameters: 9.5 Volts X 15' in prone position Electrical Stimulation Goals: Pain  Physical Therapy Assessment and Plan PT Assessment and Plan Clinical Impression Statement: Progressed stabilization exercises with prone SAR and SLR, side lying clams.  Pt. required VCs for form but with good stabilization. Able to increase reps of remaining exercises. PT Plan: Continue to progress stab and decrease pain.     Problem List Patient Active Problem List  Diagnoses  . HYPERTENSION  . SUPRAVENTRICULAR TACHYCARDIA  . Lumbar spondylosis  . Back pain    PT - End of Session Activity Tolerance: Patient tolerated treatment well General Behavior During Session: [facility] for tasks performed Cognition: [facility] for tasks performed   Angelie Kram B. Bascom Levels, PTA 06/29/2011, 1:41 PM

## 2011-07-01 ENCOUNTER — Ambulatory Visit (HOSPITAL_COMMUNITY)
Admission: RE | Admit: 2011-07-01 | Discharge: 2011-07-01 | Disposition: A | Discharge status: Home / Self Care | Payer: Medicare Other | Source: Ambulatory Visit | Attending: Family Medicine | Admitting: Family Medicine

## 2011-07-01 NOTE — Progress Notes (Signed)
Physical Therapy Treatment Patient Details  Name: Mackenzie Black MRN: [id number] Date of Birth: Jun 18, 1955  Today's Date: 07/01/2011 Time: 1312-1400 PT Time Calculation (min): 48 min  Visit#: 6  of 12   Re-eval: 07/15/11  Charge: therex 38 min estim 10 min  Authorization: MEDICARE  Authorization Time Period: G Code: CL, Goal: CJ   Authorization Visit#: 6  of 10    Subjective: Symptoms/Limitations Symptoms: Pt stated LBP not bad today, pain scale for lower back l>R 3/10.  My feet are killing me today, 10/10 pain. Pain Assessment Currently in Pain?: Yes Pain Score:   3 Pain Location: Back Pain Orientation: Left;Lower  Objective:   Exercise/Treatments Stretches Single Knee to Chest Stretch: 5 reps;10 seconds Lower Trunk Rotation: 5 reps;10 seconds Supine Ab Set: 15 reps AB Set Limitations: NMR for PFC and TrA with diaphragmatic breathing Bridge: 15 reps Sidelying Clam: 10 reps;Limitations Clam Limitations: 10 sec hold each Prone  Single Arm Raise: Limitations Single Arm Raises Limitations: time Straight Leg Raise: Limitations Straight Leg Raises Limitations: time  Modalities Modalities: Archivist Stimulation Location: B Lumbar/sacral area Electrical Stimulation Action: IFES to decrease pain Electrical Stimulation Parameters: Hi/lo sweep 10 volts x 10' in prone position Electrical Stimulation Goals: Pain  Physical Therapy Assessment and Plan PT Assessment and Plan Clinical Impression Statement: Pt late for apt, unable to complete full POC due to time.  Pt demonstrated good stabilty with activity but weakness still observed.  Attempted progressing to SLR, pt unable to complete due to hip flexion weakness and increased pain.  NMR complete with most difficulty for proper musculature with PFC and TrA, tacile and verbal cueing required.  Pt with good glut med control following NMR instructions and tactile cues for correct  mm with clam exercise, pt given printout and agreed to add exercise to HEP.  Pt reported pain reduced at end of session with estim.   PT Plan: Continue to progress stab and decrease pain.    Goals    Problem List Patient Active Problem List  Diagnoses  . HYPERTENSION  . SUPRAVENTRICULAR TACHYCARDIA  . Lumbar spondylosis  . Back pain    PT - End of Session Activity Tolerance: Patient tolerated treatment well General Behavior During Session: [facility] for tasks performed Cognition: [facility] for tasks performed  GP No functional reporting required  Juel Burrow, PTA 07/01/2011, 2:36 PM

## 2011-07-03 ENCOUNTER — Ambulatory Visit (HOSPITAL_COMMUNITY)
Admission: RE | Admit: 2011-07-03 | Discharge: 2011-07-03 | Disposition: A | Discharge status: Home / Self Care | Payer: Medicare Other | Source: Ambulatory Visit | Attending: Family Medicine | Admitting: Family Medicine

## 2011-07-03 NOTE — Progress Notes (Signed)
Physical Therapy Treatment Patient Details  Name: Mackenzie Black MRN: [id number] Date of Birth: 04-Jan-1956  Today's Date: 07/03/2011 Time: 1305-1350 PT Time Calculation (min): 45 min Charges: 10' Manual, 25' TE, 1 estim Visit#: 7  of 12   Re-eval: 07/15/11    Authorization: MEDICARE  Authorization Time Period: G Code: CL, Goal: CJ   Authorization Visit#: 7  of 10    Subjective: Symptoms/Limitations Symptoms: Pt reports her back pain is doing much better, her c/co is burning and numbness inot her feet.  Pain Assessment Currently in Pain?: Yes Pain Score:   1 Pain Location: Back Pain Orientation: Left;Lower  Precautions/Restrictions     Exercise/Treatments Supine Ab Set: 10 reps;5 seconds;Other (comment) (NMR not required, used diaphragmatic breathing) Bent Knee Raise: 10 reps;Other (comment) (BLE) Bridge: 10 reps;Other (comment) (10 sec holds) Isometric Hip Flexion: 5 reps;Other (comment);5 seconds (BLE) Prone  Other Prone Lumbar Exercises: anterior tilt 10x3 sec  Modalities Modalities: Electrical Stimulation Manual Therapy Manual Therapy: Other (comment) Other Manual Therapy: Nerve Glides to BLE in supine to hip, knee and ankle Electrical Stimulation Electrical Stimulation Location: B Lumbar/sacral area  Electrical Stimulation Action: IFES to decrease pain  Electrical Stimulation Parameters: Hi/lo sweep 10 volts x 10' in prone position   Physical Therapy Assessment and Plan PT Assessment and Plan Clinical Impression Statement: Pt is progressing well with her stability exercises and is able to demonstrate independently.  Continues to improve her NM control to her core area and requires less cueing PT Plan: Continue to progress stab and decrease pain.    Goals    Problem List Patient Active Problem List  Diagnoses  . HYPERTENSION  . SUPRAVENTRICULAR TACHYCARDIA  . Lumbar spondylosis  . Back pain    PT - End of Session Activity Tolerance: Patient  tolerated treatment well General Behavior During Session: [facility] for tasks performed Cognition: [facility] for tasks performed PT Plan of Care PT Patient Instructions: Educated pt on self care for feet as pt is unable to feel her feet and LE well.  Educated pt on importance of continuing HEP and improving her overall endurance.  Consulted and Agree with Plan of Care: Patient  GP No functional reporting required  Greer Koeppen 07/03/2011, 1:42 PM

## 2011-07-06 ENCOUNTER — Ambulatory Visit (HOSPITAL_COMMUNITY)
Admission: RE | Admit: 2011-07-06 | Discharge: 2011-07-06 | Disposition: A | Discharge status: Home / Self Care | Payer: Medicare Other | Source: Ambulatory Visit | Attending: Family Medicine | Admitting: Family Medicine

## 2011-07-06 NOTE — Progress Notes (Signed)
Physical Therapy Treatment Patient Details  Name: Mackenzie Black MRN: [id number] Date of Birth: [birth year]  Today's Date: 07/06/2011 Time: 1015-1105 PT Time Calculation (min): 50 min Charges: 25' TE, 15' NMR, 1 estim Visit#: 8  of 12   Re-eval: 07/15/11    Authorization: MEDICARE  Authorization Time Period: G Code: CL, Goal: CJ   Authorization Visit#: 8  of 10    Subjective: Symptoms/Limitations Symptoms: Pt reports that she is hurting all over today.  She states her back started to hurt a little bit on Saturday and she tried some exercises ( clams and leg lifts) and it increased her pain slightly.  Pain Assessment Currently in Pain?: Yes Pain Score:   5 Pain Location: Back Pain Orientation: Left Pain Type: Chronic pain  Exercise/Treatments Stretches Lower Trunk Rotation: 5 reps;10 seconds Piriformis Stretch: 3 reps;60 seconds Seated Other Seated Lumbar Exercises: Heel and Toe Roll in and outs w/ball and t-band resistance 10x10 sec holds Supine Bridge: 15 reps Prone  Opposite Arm/Leg Raise: Right arm/Left leg;10 reps (For NMR to L multifidus) Other Prone Lumbar Exercises: NMR to establish L multifidus activiation using visual, VC's and TC's. w/ant tilt to follow x12 w/TC's to L multifidus  Modalities Modalities: Copywriter, advertising Location: B Lumbar/sacral area  Electrical Stimulation Action: IFES to decrease pain  Electrical Stimulation Parameters: Hi/lo sweep 14 volts x 10' in prone position  Electrical Stimulation Goals: Pain  Physical Therapy Assessment and Plan PT Assessment and Plan Clinical Impression Statement: Treatment continues to focus on decreasing pain by use of NMR and modalities.  Reports decreased pain after treatment today.  PT Plan: Continue to progress stab and decrease pain and improve core stability.    Goals    Problem List Patient Active Problem List  Diagnoses  . HYPERTENSION  .  SUPRAVENTRICULAR TACHYCARDIA  . Lumbar spondylosis  . Back pain    PT - End of Session Activity Tolerance: Patient tolerated treatment well General Behavior During Session: [facility] for tasks performed Cognition: [facility] for tasks performed PT Plan of Care PT Patient Instructions: Educated pt on self care for feet as pt is unable to feel her feet and LE well.  Educated pt on importance of continuing HEP and improving her overall endurance.  Consulted and Agree with Plan of Care: Patient  GP No functional reporting required  Darcelle Herrada 07/06/2011, 11:23 AM

## 2011-07-08 ENCOUNTER — Ambulatory Visit (HOSPITAL_COMMUNITY)
Admission: RE | Admit: 2011-07-08 | Discharge: 2011-07-08 | Disposition: A | Discharge status: Home / Self Care | Payer: Medicare Other | Source: Ambulatory Visit | Attending: Family Medicine | Admitting: Family Medicine

## 2011-07-08 NOTE — Progress Notes (Addendum)
Physical Therapy Treatment Patient Details  Name: Mackenzie Black MRN: [id number] Date of Birth: [birth year]  Today's Date: 07/08/2011 Time: 9562-1308 PT Time Calculation (min): 43 min Charges: 42' TE, 5' Manual Visit#: 9  of 12   Re-eval: 07/15/11  Authorization: MEDICARE  Authorization Time Period: G Code: CL, Goal: CJ   Authorization Visit#: 9  of 10    Subjective: Symptoms/Limitations Symptoms: Pt reports that her pain in her back is not bad today and her feet feel pretty good.  She declines to try to TM. reports she was feeling good after stretching last treatment.  "I am checking to bottom of my feet at least 1x/day" Pain Assessment Currently in Pain?: Yes Pain Score:   6 Pain Location: Foot Pain Orientation: Right;Left Pain Type: Neuropathic pain  Exercise/Treatments Stretches Single Knee to Chest Stretch: 3 reps;30 seconds (BLE) Piriformis Stretch: 3 reps;30 seconds (BLE) Standing Heel Raises: 10 reps;Limitations Heel Raises Limitations: Toe Raises 10x Functional Squats: 10 reps Seated Other Seated Lumbar Exercises: Sitting with proper posture x4 min w/mod cueing for inital posture, able to maintain I for 3:30 Sidelying Clam: 5 reps Clam Limitations: 15 sec BLE Prone  Other Prone Lumbar Exercises: anterior tilt w/L visual cuing 10x 5-10 sec hold, able to demo independently Other Prone Lumbar Exercises: Heel Squeeze 5x5 sec holds  Manual Therapy Manual Therapy: Other (comment) Other Manual Therapy: Nerve Glides to BLE in supine to hip, knee and ankle.  Pain before: 6/10 to BLE, after 4/10  Physical Therapy Assessment and Plan PT Assessment and Plan Clinical Impression Statement: Pt able to tolerate 4 minutes of standing activities today before feet began to hurt, demonstrates signficant improvement in motor control to L multifidus and TrA muscules. Added squats, heel/toe raises, tandem stance, seated posture, heel squeezes today.  Had decreased pain after nerve  glides.  Declined e-stim.  PT Plan: G-code next visit    Goals    Problem List Patient Active Problem List  Diagnoses  . HYPERTENSION  . SUPRAVENTRICULAR TACHYCARDIA  . Lumbar spondylosis  . Back pain    PT - End of Session Activity Tolerance: Patient tolerated treatment well General Behavior During Session: [facility] for tasks performed Cognition: [facility] for tasks performed PT Plan of Care PT Patient Instructions: Educated pt on proper posture Consulted and Agree with Plan of Care: Patient  GP No functional reporting required  Earlee Herald 07/08/2011, 12:02 PM

## 2011-07-10 ENCOUNTER — Ambulatory Visit (HOSPITAL_COMMUNITY)
Admission: RE | Admit: 2011-07-10 | Discharge: 2011-07-10 | Disposition: A | Discharge status: Home / Self Care | Payer: Medicare Other | Source: Ambulatory Visit | Attending: Family Medicine | Admitting: Family Medicine

## 2011-07-10 NOTE — Evaluation (Signed)
Physical Therapy Re-Evaluation  Patient Details  Name: Mackenzie Black MRN: [id number] Date of Birth: April 08, 1955  Today's Date: 07/10/2011 Time: 1023-1101 PT Time Calculation (min): 38 min Charges: 1 ROM, 1 MMT, 33' Self Care Visit#: 10  of 18   Re-eval: 08/09/11    Authorization: MEDICARE  Authorization Time Period: G Code: CK, Goal: CJ   Authorization Visit#: 10  of 20    Past Medical History: No past medical history on file. Past Surgical History:  Past Surgical History  Procedure Date  . Tonsillectomy   . Excision of cyst of hand   . Tubal ligation   . Knee surgery   . Bilateral foot surgery   . Colonoscopy 03/31/2011    Procedure: COLONOSCOPY;  Surgeon: Dalia Heading, MD;  Location: [facility];  Service: Gastroenterology;  Laterality: N/A;    Subjective Symptoms/Limitations Symptoms: She reports that she has good days and bad days.  Range 1-5/10.  On average is a 3/10 for 50% of her day.  She is able to move around a little easier.  Continues to have difficulty with sitting for a long period of time.  She is still attempting to seek advice from a neurologist for her B feet pain. How long can you sit comfortably?: 10-15 minutes.  L side back still is painful, reports decreased radicular pain to LLE (used to have radicular pain to L leg) How long can you stand comfortably?: she is limited by feet pain. She reports that her back no longer bothers her and does not limit her ability to stand or shop. How long can you walk comfortably?: Less than 15 minutes because of buring in her feet.  Pain Assessment Currently in Pain?: Yes Pain Score:   3 Pain Location: Back  Sensation/Coordination/Flexibility/Functional Tests Functional Tests Functional Tests: ODI: 56% (was Oswestry Low Back Pain Scale (ODI): 72%)  Assessment RLE Strength Right Hip Flexion: 4/5 (as 3/5) Right Hip Extension: 3+/5 (was 2+/5, taken in prone position both times) Right Hip ABduction: 3+/5 (was  3+/5) Right Hip ADduction: 3+/5 (was 3+/5) Right Knee Flexion: 4/5 (was 3+/5) Right Knee Extension: 4/5 (was 3+/5) LLE Strength LLE Overall Strength Comments: Strength taken in the seated position Left Hip Flexion: 4/5 Left Hip Extension: 3+/5 (was 3+/5, taken in prone position both times) Left Hip ABduction: 3+/5 (was 3/5) Left Hip ADduction: 3+/5 (was 3/5) Left Knee Flexion: 5/5 (was 3+/5) Left Knee Extension: 5/5 (was 3+/5) Lumbar AROM Lumbar Flexion: Decreased 10% w/increased pain at end range (was 60% w/increased pain) Lumbar Extension: Decreased 25% with most pain w/pure extension: R and L quadrant WNL w/pain (Decreased 50%; L quadrant extension most painful R quadrant ) Lumbar - Right Side Bend: WNL w/pain at end range (Decreased 50% with increased paini) Lumbar - Left Side Bend: WNL - w/pain at end range  (Decreased 50% w/increased ) Lumbar Strength Overall Lumbar Strength Comments: TrA and Multifidus: 4/10 (TrA and Multifidus Strength (Hodges Score): 0/10) Palpation Palpation: Moderate pain and tenderness with decreased muscles spasms. Impaired L multifidus activation compared to R (Significant pain and tenderness with mild muscular spams)  Exercise/Treatments Mobility/Balance  Static Standing Balance Single Leg Stance - Right Leg: 10  Single Leg Stance - Left Leg: 5  Tandem Stance - Right Leg: 6  Tandem Stance - Left Leg: 6      Physical Therapy Assessment and Plan PT Assessment and Plan Clinical Impression Statement: Ms. Mackenzie Black has attended 10 OP PT and is making progress towards her goals,  however continues to demonstrate most disabililty secondary to increased pain in her bilateral feet.  Pt will benefit from skilled therapeutic intervention in order to improve on the following deficits: Pain;Decreased strength;Decreased range of motion;Decreased mobility;Increased fascial restricitons;Improper body mechanics;Decreased balance Rehab Potential: Good PT Frequency: Min  2X/week PT Duration: 4 weeks PT Treatment/Interventions: Functional mobility training;Therapeutic activities;Therapeutic exercise;Neuromuscular re-education;Balance training PT Plan: cont to adddress pain and improve strength and balance (as allowed by LE pain)    Goals Home Exercise Program Pt will Perform Home Exercise Program: Independently PT Goal: Perform Home Exercise Program - Progress: Met PT Short Term Goals Time to Complete Short Term Goals: 2 weeks PT Short Term Goal 1: Pt will report pain less than 5/10 for 50% of her day.  PT Short Term Goal 1 - Progress: Met PT Short Term Goal 2: Pt will improve her core strength to a 5/10 on Hodges Score for her TrA and multifidus  PT Short Term Goal 2 - Progress: Met PT Short Term Goal 3: Pt will improve her Lumbar AROM by 15% in each direction with reports of less pain.  PT Short Term Goal 3 - Progress: Met PT Short Term Goal 4: Pt will improve her LE strength by 1/2 muscle grade.  PT Short Term Goal 5: Pt will demonstrate both R and L SLS x20 sec on static surface.  PT Short Term Goal 5 - Progress: Progressing toward goal PT Long Term Goals Time to Complete Long Term Goals: 8 weeks PT Long Term Goal 1: Pt will improve her core strength in order to tolerate sitting in a car for greater for 1 hour in order to go shopping and site seeing.  PT Long Term Goal 2: Pt will improve her LE and core endurance report about walking for 30 minutes so that she can continue with exercise and walking her dogs.  Long Term Goal 3: Pt will report pain less than 3/10 for 75% of her day for improved QOL.  Long Term Goal 4: Pt will improve her ODI to less than 62% for improved percieved funcitonal ability.   Problem List Patient Active Problem List  Diagnoses  . HYPERTENSION  . SUPRAVENTRICULAR TACHYCARDIA  . Lumbar spondylosis  . Back pain    PT - End of Session Activity Tolerance: Patient tolerated treatment well General Behavior During Session:  [facility] for tasks performed Cognition: [facility] for tasks performed PT Plan of Care PT Patient Instructions: Education on continued importance for HEP.  Discussed symptoms of periphreal neuropathy and educated on NM control to improve balance.  Consulted and Agree with Plan of Care: Patient  GP  Functional Reporting Modifier  Current Status  [phone] - Changing & Maintaing Body Position CK - At least 40% but less than 60% impaired, limited or restricted  Goal Status  [phone] - Changing & Maintaing Body Position CJ - At least 20% but less than 40% impaired, limited or restricted   Markon Jares 07/10/2011, 12:55 PM  Physician Documentation Your signature is required to indicate approval of the treatment plan as stated above.  Please sign and either send electronically or make a copy of this report for your files and return this physician signed original.   Please mark one 1.__approve of plan  2. ___approve of plan with the following conditions.   ______________________________  _____________________ Physician Signature                                                                                                             Date

## 2011-08-06 ENCOUNTER — Other Ambulatory Visit (HOSPITAL_COMMUNITY): Payer: Self-pay | Admitting: Family Medicine

## 2011-08-06 DIAGNOSIS — Z139 Encounter for screening, unspecified: Secondary | ICD-10-CM

## 2011-09-15 ENCOUNTER — Ambulatory Visit (HOSPITAL_COMMUNITY)
Admission: RE | Admit: 2011-09-15 | Discharge: 2011-09-15 | Disposition: A | Discharge status: Home / Self Care | Payer: Medicare Other | Source: Ambulatory Visit | Attending: Family Medicine | Admitting: Family Medicine

## 2011-09-15 DIAGNOSIS — Z1231 Encounter for screening mammogram for malignant neoplasm of breast: Secondary | ICD-10-CM | POA: Insufficient documentation

## 2011-09-15 DIAGNOSIS — Z139 Encounter for screening, unspecified: Secondary | ICD-10-CM

## 2011-09-21 ENCOUNTER — Other Ambulatory Visit: Payer: Self-pay | Admitting: Family Medicine

## 2011-09-21 DIAGNOSIS — R928 Other abnormal and inconclusive findings on diagnostic imaging of breast: Secondary | ICD-10-CM

## 2011-09-30 ENCOUNTER — Ambulatory Visit (HOSPITAL_COMMUNITY)
Admission: RE | Admit: 2011-09-30 | Discharge: 2011-09-30 | Disposition: A | Discharge status: Home / Self Care | Payer: Medicare Other | Source: Ambulatory Visit | Attending: Family Medicine | Admitting: Family Medicine

## 2011-09-30 DIAGNOSIS — R928 Other abnormal and inconclusive findings on diagnostic imaging of breast: Secondary | ICD-10-CM

## 2011-10-28 ENCOUNTER — Ambulatory Visit (HOSPITAL_COMMUNITY)
Admission: RE | Admit: 2011-10-28 | Discharge: 2011-10-28 | Disposition: A | Discharge status: Home / Self Care | Payer: Medicare Other | Source: Ambulatory Visit | Attending: Diagnostic Neuroimaging | Admitting: Diagnostic Neuroimaging

## 2011-10-28 DIAGNOSIS — I10 Essential (primary) hypertension: Secondary | ICD-10-CM | POA: Insufficient documentation

## 2011-10-28 DIAGNOSIS — M6281 Muscle weakness (generalized): Secondary | ICD-10-CM | POA: Insufficient documentation

## 2011-10-28 DIAGNOSIS — M549 Dorsalgia, unspecified: Secondary | ICD-10-CM | POA: Insufficient documentation

## 2011-10-28 DIAGNOSIS — M25676 Stiffness of unspecified foot, not elsewhere classified: Secondary | ICD-10-CM | POA: Insufficient documentation

## 2011-10-28 DIAGNOSIS — R29898 Other symptoms and signs involving the musculoskeletal system: Secondary | ICD-10-CM | POA: Insufficient documentation

## 2011-10-28 DIAGNOSIS — R262 Difficulty in walking, not elsewhere classified: Secondary | ICD-10-CM | POA: Insufficient documentation

## 2011-10-28 DIAGNOSIS — IMO0001 Reserved for inherently not codable concepts without codable children: Secondary | ICD-10-CM | POA: Insufficient documentation

## 2011-10-28 NOTE — Evaluation (Signed)
Physical Therapy Evaluation  Patient Details  Name: Mackenzie Black MRN: [id number] Date of Birth: [birth year]  Today's Date: 10/28/2011 Time: 1105-1156 PT Time Calculation (min): 51 min  Visit#: 1  of 8   Re-eval: 11/27/11 Assessment Diagnosis: neuropathy Next MD Visit: 12/18/2011  Authorization: Medicare  Authorization Time Period:    Authorization Visit#: 1  of 10    Past Medical History: No past medical history on file. Past Surgical History:  Past Surgical History  Procedure Date  . Tonsillectomy   . Excision of cyst of hand   . Tubal ligation   . Knee surgery   . Bilateral foot surgery   . Colonoscopy 03/31/2011    Procedure: COLONOSCOPY;  Surgeon: Dalia Heading, MD;  Location: [facility];  Service: Gastroenterology;  Laterality: N/A;    Subjective Symptoms/Limitations Symptoms: Mackenzie Black states she has a lot of pain in both feet but the left is greater than the right.  She has had bunionectomy surgery in the L 2011 ; R 2012; The patient states she has a continual throbbing pain that starts from the bottom of her foot and works it way up to the ankle.  She states that occasionally she will have tingling and numbing in her feet as well Pertinent History: boarderline DM; RA How long can you sit comfortably?: sitting is no problem. How long can you stand comfortably?: Pt can stand for fifteen minutes and then the pain will begin. How long can you walk comfortably?: The patient will have significant increased pain after ten to fifteen minutes. Pain Assessment Currently in Pain?: Yes Pain Score:   5 (Pt states the worst would be a 12/10) Pain Location: Foot Pain Orientation: Right;Left   Prior Functioning  Home Living Lives With: Spouse Prior Function Vocation: On disability Leisure: Hobbies-yes (Comment) Comments: shopping, walking but she no longer is able to do this  Cognition/Observation Cognition Overall Cognitive Status: Appears within functional  limits for tasks assessed  Sensation/Coordination/Flexibility/Functional Tests Functional Tests Functional Tests: LEFS 16/64  Assessment RLE AROM (degrees) Right Ankle Dorsiflexion: -20  Right Ankle Plantar Flexion: 50  Right Ankle Inversion: 12  Right Ankle Eversion: 2  RLE PROM (degrees) Right Ankle Dorsiflexion: 0  Right Ankle Inversion: 30  Right Ankle Eversion: 15  RLE Strength Right Ankle Dorsiflexion: 2-/5 Right Ankle Plantar Flexion: 2/5 Right Ankle Inversion: 2-/5 Right Ankle Eversion: 2-/5 LLE AROM (degrees) Left Ankle Dorsiflexion: -24  Left Ankle Plantar Flexion: 35  Left Ankle Inversion: 2  Left Ankle Eversion: 12  LLE PROM (degrees) Left Ankle Dorsiflexion: 5  LLE Strength Left Ankle Dorsiflexion: 2-/5 Left Ankle Plantar Flexion: 2-/5 Left Ankle Inversion: 2-/5 Left Ankle Eversion: 2-/5  Exercise/Treatments Mobility/Balance  Static Standing Balance Single Leg Stance - Right Leg: 3  Single Leg Stance - Left Leg: 1    Ankle Exercises - Supine Isometrics:  (5x all B/ AA ROM 5 x B)      Physical Therapy Assessment and Plan PT Assessment and Plan Clinical Impression Statement: Pt with decreased AROM, strength and proprioception causing increased pain with WB activities.  Pt will benefirt from maximize functional level and improve her quality of life. Pt will benefit from skilled therapeutic intervention in order to improve on the following deficits: Decreased activity tolerance;Decreased strength;Difficulty walking;Decreased range of motion;Pain;Decreased balance Rehab Potential: Good PT Frequency: Min 2X/week PT Duration: 4 weeks PT Treatment/Interventions: Therapeutic exercise;Therapeutic activities;Modalities;Manual techniques PT Plan: Pt to begin SLS; rockerboard, standing heelraise/toe raise; towel crunch next treatment.  Third treatment begin SL in/eversion, sitting DF w/weight if able.    Goals Home Exercise Program Pt will Perform Home  Exercise Program: Independently PT Short Term Goals Time to Complete Short Term Goals: 2 weeks PT Short Term Goal 1: Pt pain to be no greater than an 8/10 PT Short Term Goal 2: Pt to be able to walk for 20 minutes without increased pain PT Short Term Goal 3: Pt strength to be increased 1/2 grade PT Short Term Goal 4: increase SLS to 8 PT Long Term Goals Time to Complete Long Term Goals: 4 weeks PT Long Term Goal 1: Pt pain level to be no greater than a 5 and at a 3 80% of the day.; PT Long Term Goal 2: Pt to be able to walk for 30 minutes for shopping and recreation with no increased pain. Long Term Goal 3: Pt strength wnl Long Term Goal 4: Pt LEFS to be increased by 10 points. PT Long Term Goal 5: Increase SLS to 12  Problem List Patient Active Problem List  Diagnosis  . HYPERTENSION  . SUPRAVENTRICULAR TACHYCARDIA  . Lumbar spondylosis  . Back pain  . Difficulty in walking  . Stiffness of joint, not elsewhere classified, ankle and foot  . Weakness of both legs    PT - End of Session Activity Tolerance: Patient tolerated treatment well General Behavior During Session: [facility] for tasks performed Cognition: [facility] for tasks performed PT Plan of Care PT Home Exercise Plan: given PT Patient Instructions: complete 2-3 times a day Consulted and Agree with Plan of Care: Patient  GP Functional Assessment Tool Used: LEFS score 16/64 taking running and hopping out of score.  25%  Functional Limitation: Mobility: Walking and moving around Mobility: Walking and Moving Around Current Status [phone]): At least 60 percent but less than 80 percent impaired, limited or restricted Mobility: Walking and Moving Around Goal Status [phone]): At least 20 percent but less than 40 percent impaired, limited or restricted  Mackenzie Black 10/28/2011, 12:07 PM  Physician Documentation Your signature is required to indicate approval of the treatment plan as stated above.  Please sign and either send  electronically or make a copy of this report for your files and return this physician signed original.   Please mark one 1.__approve of plan  2. ___approve of plan with the following conditions.   ______________________________                                                          _____________________ Physician Signature                                                                                                             Date

## 2011-11-02 ENCOUNTER — Ambulatory Visit (HOSPITAL_COMMUNITY)
Admission: RE | Admit: 2011-11-02 | Discharge: 2011-11-02 | Disposition: A | Discharge status: Home / Self Care | Payer: Medicare Other | Source: Ambulatory Visit | Attending: Family Medicine | Admitting: Family Medicine

## 2011-11-02 NOTE — Progress Notes (Signed)
Physical Therapy Treatment Patient Details  Name: Mackenzie Black MRN: [id number] Date of Birth: Feb 01, 1956  Today's Date: 11/02/2011 Time: 0935-1020 PT Time Calculation (min): 45 min  Visit#: 2  of 8   Re-eval: 11/27/11 Charges: Therex x 30' Manual x 10'  Authorization: Medicare  Authorization Visit#: 2  of 10    Subjective: Symptoms/Limitations Symptoms: Pt reports HEP compliance. Pain Assessment Currently in Pain?: Yes Pain Score:   6 Pain Location: Foot Pain Orientation: Right;Left   Exercise/Treatments Ankle Exercises - Standing SLS: L:4" R:3" max of 3 Rocker Board: 2 minutes;Limitations Rocker Board Limitations: A/P R/L Heel Raises: 10 reps Toe Raise: 10 reps Ankle Exercises - Seated Towel Crunch: Limitations Towel Crunch Limitations: 2' Ankle Exercises - Supine Isometrics: x 10 all directions B ankles  Manual Therapy Manual Therapy: Massage Massage: STM/retro massage/scar tissue massage to B feet around incisions to decrease pain, sensitivity and scar tissue adhesions.  Physical Therapy Assessment and Plan PT Assessment and Plan Clinical Impression Statement: Pt completes therex well with minimal need for cueing. Pt is hypersensitive to light touch on and around incision on B feet. PT instructed on way to desensitize theses areas. Pt reports pain increase to 8/10 after therex then a decrease to 7/10 after manual techniques. PT Plan: Continue per PT POC. Begin SL in/eversion and sitting DF w/weight next session if able.      Problem List Patient Active Problem List  Diagnosis  . HYPERTENSION  . SUPRAVENTRICULAR TACHYCARDIA  . Lumbar spondylosis  . Back pain  . Difficulty in walking  . Stiffness of joint, not elsewhere classified, ankle and foot  . Weakness of both legs     GP Functional Assessment Tool Used: LEFS score 16/64 taking running and hopping out of score.  25%   Seth Bake, PTA 11/02/2011, 1:14 PM

## 2011-11-04 ENCOUNTER — Ambulatory Visit (HOSPITAL_COMMUNITY)
Admission: RE | Admit: 2011-11-04 | Discharge: 2011-11-04 | Disposition: A | Discharge status: Home / Self Care | Payer: Medicare Other | Source: Ambulatory Visit | Attending: Family Medicine | Admitting: Family Medicine

## 2011-11-04 NOTE — Progress Notes (Signed)
Physical Therapy Treatment Patient Details  Name: Mackenzie Black MRN: [id number] Date of Birth: February 03, 1956  Today's Date: 11/04/2011 Time: 1016-1100 PT Time Calculation (min): 44 min Charges:  Therex 24', Estim unattended and ice X 1 unit each  Visit#: 3  of 8   Re-eval: 11/27/11 Diagnosis: neuropathy Next MD Visit: 12/18/2011  Authorization: Medicare  Authorization Visit#: 3  of 10    Subjective: Symptoms/Limitations Symptoms: Pain remains high at 8/10.  Pt. states pain increased after last session and has remained high since. Pain Assessment Currently in Pain?: Yes Pain Score:   8 Pain Location: Foot Pain Orientation: Right;Left    Exercise/Treatments Ankle Exercises - Standing SLS: L:4" R:6" max of 3 Rocker Board: 2 minutes;Limitations Rocker Board Limitations: A/P R/L Heel Raises: 15 reps Toe Raise: 15 reps Ankle Exercises - Seated Towel Crunch: Limitations Towel Crunch Limitations: 2' Other Seated Ankle Exercises: Dorsiflexion 10reps each Ankle Exercises - Sidelying Ankle Inversion: 10 reps;Both;Weights Ankle Eversion: 10 reps;Both;Weights   Modalities Modalities: Cryotherapy;Electrical Stimulation Cryotherapy Number Minutes Cryotherapy: 10 Minutes Cryotherapy Location:  (B feet) Type of Cryotherapy: Ice pack Pharmacologist Location: B Musician Action: Hi Volt to decrease sensitivity/pain Electrical Stimulation Parameters: B feet with icepack, 140 Volts Electrical Stimulation Goals: Pain  Physical Therapy Assessment and Plan PT Assessment and Plan Clinical Impression Statement: Changed modality to hi volt estim with ice to B feet to see if helps decrease pain and sensitivity. No immediate change/improvement at end of session.  Added SL inversion/eversion and seated DF to help increase strength. Pt. was unable to perform with weight due to weakness PT Plan: Continue per POC; Advance weights as able;  Assess pain next visit and which modality pt. prefers.     Problem List Patient Active Problem List  Diagnosis  . HYPERTENSION  . SUPRAVENTRICULAR TACHYCARDIA  . Lumbar spondylosis  . Back pain  . Difficulty in walking  . Stiffness of joint, not elsewhere classified, ankle and foot  . Weakness of both legs    PT - End of Session Activity Tolerance: Patient tolerated treatment well General Behavior During Session: [facility] for tasks performed Cognition: [facility] for tasks performed   Lurena Nida, PTA/CLT 11/04/2011, 11:03 AM

## 2011-11-05 ENCOUNTER — Telehealth (HOSPITAL_COMMUNITY): Payer: Self-pay | Admitting: Physical Therapy

## 2011-11-11 ENCOUNTER — Ambulatory Visit (HOSPITAL_COMMUNITY): Payer: Medicare Other | Admitting: Physical Therapy

## 2011-11-12 ENCOUNTER — Ambulatory Visit (HOSPITAL_COMMUNITY)
Admission: RE | Admit: 2011-11-12 | Discharge: 2011-11-12 | Disposition: A | Discharge status: Home / Self Care | Payer: Medicare Other | Source: Ambulatory Visit | Attending: Family Medicine | Admitting: Family Medicine

## 2011-11-12 NOTE — Progress Notes (Signed)
Physical Therapy Treatment Patient Details  Name: ARMANDO LAUMAN MRN: [id number] Date of Birth: 12/27/1955  Today's Date: 11/12/2011 Time: 8295-6213 PT Time Calculation (min): 47 min  Visit#: 4  of 8   Re-eval: 11/27/11 Charges: Therex x 27' Manual x 15'  Authorization: Medicare   Authorization Visit#: 4  of 10    Subjective: Symptoms/Limitations Symptoms: Pt states that her pain is not any better. Pain Assessment Currently in Pain?: Yes Pain Score:   7 Pain Location: Foot Pain Orientation: Right;Left   Exercise/Treatments Ankle Exercises - Standing SLS: L:3 R:6 max of 3 Rocker Board: 2 minutes;Limitations Rocker Board Limitations: A/P R/L Heel Raises: 15 reps Toe Raise: 15 reps Ankle Exercises - Seated Towel Crunch: Limitations Towel Crunch Limitations: 2' Towel Inversion/Eversion: 5 reps (5 reps of actual motion not moving towel)    Manual Therapy Manual Therapy: Massage Massage: STM/MFR to B medial feet x 15'  Physical Therapy Assessment and Plan PT Assessment and Plan Clinical Impression Statement: Estim not completed secondary to no relief after last tx. Began inversion and eversion with towel with multimodal cueing for proper technique. Pt is less sensitive to touch on B feet this session. Fascial restrictions noted with manual techniques. Restrictions decreased 50% after manual. Pt reports pain decrease to 5/10 at end of session. PT Plan: Continue to progress strength/stability and decrease pain per PT POC.     Problem List Patient Active Problem List  Diagnosis  . HYPERTENSION  . SUPRAVENTRICULAR TACHYCARDIA  . Lumbar spondylosis  . Back pain  . Difficulty in walking  . Stiffness of joint, not elsewhere classified, ankle and foot  . Weakness of both legs    PT - End of Session Activity Tolerance: Patient tolerated treatment well General Behavior During Session: [facility] for tasks performed Cognition: [facility] for tasks performed  Seth Bake,  PTA 11/12/2011, 10:30 AM

## 2011-11-16 ENCOUNTER — Ambulatory Visit (HOSPITAL_COMMUNITY)
Admission: RE | Admit: 2011-11-16 | Discharge: 2011-11-16 | Disposition: A | Discharge status: Home / Self Care | Payer: Medicare Other | Source: Ambulatory Visit | Attending: Diagnostic Neuroimaging | Admitting: Diagnostic Neuroimaging

## 2011-11-16 NOTE — Progress Notes (Signed)
Physical Therapy Treatment Patient Details  Name: Mackenzie Black MRN: [id number] Date of Birth: Feb 07, 1956  Today's Date: 11/16/2011 Time: 1018-1105 PT Time Calculation (min): 47 min  Visit#: 5  of 8   Re-eval: 11/27/11 Charges: MHP w/Estim x 20' Self care x 15'  Authorization: Medicare  Authorization Visit#: 5  of 10    Subjective: Symptoms/Limitations Symptoms: Pt states that she felt better after last session but she had a lot of pain over the weekend. Pain Assessment Currently in Pain?: Yes Pain Score:   9 Pain Location: Foot Pain Orientation: Right;Left   Exercise/Treatments  Modalities Modalities: Electrical Stimulation;Moist Heat Moist Heat Therapy Number Minutes Moist Heat: 20 Minutes (w/ pre-mod) Moist Heat Location: Other (comment) (B feet) Electrical Stimulation Electrical Stimulation Location: B feet Electrical Stimulation Action: Hi Volt to decrease sensitivity/pain Electrical Stimulation Parameters: B feet with MHP L 13-15 Electrical Stimulation Goals: Pain  Physical Therapy Assessment and Plan PT Assessment and Plan Clinical Impression Statement: Standing theres held this session secondary to 9/10 pain. Pt educated on benefits fo [place] but pt not ready for this tx as her feet are extremely sensitive to touch. Began estim with MHP this session to decrease pain. Pt refuses manaul therapy. Pt reprots pain decrease to 6/10 at end of session. PT Plan: Continue to progress strength/stability and decrease pain per PT POC.     Problem List Patient Active Problem List  Diagnosis  . HYPERTENSION  . SUPRAVENTRICULAR TACHYCARDIA  . Lumbar spondylosis  . Back pain  . Difficulty in walking  . Stiffness of joint, not elsewhere classified, ankle and foot  . Weakness of both legs    PT - End of Session Activity Tolerance: Patient tolerated treatment well General Behavior During Session: [facility] for tasks performed Cognition: [facility] for tasks performed PT Plan of  Care PT Home Exercise Plan: Pt educated on completed STM to B feet in the mornings before getting out of bed. Pt also educated on benefits of moist heat.  Seth Bake, PTA 11/16/2011, 11:41 AM

## 2011-11-18 ENCOUNTER — Ambulatory Visit (HOSPITAL_COMMUNITY)
Admission: RE | Admit: 2011-11-18 | Discharge: 2011-11-18 | Disposition: A | Discharge status: Home / Self Care | Payer: Medicare Other | Source: Ambulatory Visit | Attending: Diagnostic Neuroimaging | Admitting: Diagnostic Neuroimaging

## 2011-11-18 DIAGNOSIS — R29898 Other symptoms and signs involving the musculoskeletal system: Secondary | ICD-10-CM

## 2011-11-18 DIAGNOSIS — R262 Difficulty in walking, not elsewhere classified: Secondary | ICD-10-CM | POA: Insufficient documentation

## 2011-11-18 DIAGNOSIS — I10 Essential (primary) hypertension: Secondary | ICD-10-CM | POA: Insufficient documentation

## 2011-11-18 DIAGNOSIS — M6281 Muscle weakness (generalized): Secondary | ICD-10-CM | POA: Insufficient documentation

## 2011-11-18 DIAGNOSIS — M25673 Stiffness of unspecified ankle, not elsewhere classified: Secondary | ICD-10-CM

## 2011-11-18 DIAGNOSIS — IMO0001 Reserved for inherently not codable concepts without codable children: Secondary | ICD-10-CM | POA: Insufficient documentation

## 2011-11-18 DIAGNOSIS — M549 Dorsalgia, unspecified: Secondary | ICD-10-CM | POA: Insufficient documentation

## 2011-11-18 NOTE — Evaluation (Signed)
Physical Therapy  Re-Evaluation  Patient Details  Name: Mackenzie Black MRN: [id number] Date of Birth: 03-Dec-1955  Today's Date: 11/18/2011 Time: 1015-1106 PT Time Calculation (min): 51 min  Visit#: 6  of 8   Re-eval: 11/18/11 Assessment Diagnosis: neuropathy Next MD Visit: 12/18/2011  Authorization: Medicare    Authorization Visit#: 6  of 8    Past Medical History: No past medical history on file. Past Surgical History:  Past Surgical History  Procedure Date  . Tonsillectomy   . Excision of cyst of hand   . Tubal ligation   . Knee surgery   . Bilateral foot surgery   . Colonoscopy 03/31/2011    Procedure: COLONOSCOPY;  Surgeon: Dalia Heading, MD;  [facility]: [facility];  Service: Gastroenterology;  Laterality: N/A;    Subjective Symptoms/Limitations Symptoms: Ms. Osceola states that she has not noticed any difference except that her feet are not as sensitive as they use to be.  Pt states she is still having a lot of pain with her ankles How long can you sit comfortably?: Pt has no problem sitting. How long can you stand comfortably?: The patient states taht she can stand for 20 minutes before she has increased pain.  The patient was able to stand for 15 minutes. How long can you walk comfortably?: The patient will have significant increased pain after 30 minutes on level ground was 0-15 minutes.  It is very uncomfortable to walk on uneven ground. Pain Assessment Currently in Pain?: Yes Pain Score:   7 (worst has been a 10/10 best 3/10) Pain Location: Foot Pain Orientation: Right;Left   Assessment RLE AROM (degrees) Right Ankle Dorsiflexion: 0  (was -20) Right Ankle Plantar Flexion: 55  (was 50) Right Ankle Inversion: 25  (was 12) Right Ankle Eversion: 10  (was 2) RLE PROM (degrees) Right Ankle Dorsiflexion: 20  (was 0) Right Ankle Plantar Flexion: 55  Right Ankle Inversion: 30  Right Ankle Eversion: 15  RLE Strength Right Ankle Dorsiflexion: 2/5 (was  2-) Right Ankle Plantar Flexion: 2/5 (was 2) Right Ankle Inversion: 2/5 (was 2-) Right Ankle Eversion: 2/5 (was 2-) LLE AROM (degrees) Left Ankle Dorsiflexion: 5  (was -24) Left Ankle Plantar Flexion: 50  (was 35) Left Ankle Inversion: 20  (was 2) Left Ankle Eversion: 10  (was12) LLE PROM (degrees) Left Ankle Dorsiflexion:  (PROM for all mm is wnl) LLE Strength Left Ankle Dorsiflexion: 2/5 (during ex pt exhibits mm strength that would require 3/5 or greater ) Left Ankle Plantar Flexion: 2/5 (was 2-) Left Ankle Inversion: 2/5 (was 2-) Left Ankle Eversion: 2/5 (was 2-)  Exercise/Treatments Mobility/Balance  Static Standing Balance Single Leg Stance - Right Leg: 9  Single Leg Stance - Left Leg: 3        Ankle Exercises - Standing SLS: L 3 R 9    Ankle Exercises - Supine T-Band:  (all w/ green tband;  given t-band and instruction for HEP)       Physical Therapy Assessment and Plan PT Assessment and Plan Clinical Impression Statement: Pt states she does not feel therapy is helping;  ROM has improved; strength is difficult to assess due to functional activity that pt is doing in department requires 3+ mm strength but mm test shows no greater than a 2/5 strength.  Pt does not seem to be improving with therapy will D/C to a HEP Pt will benefit from skilled therapeutic intervention in order to improve on the following deficits: Decreased activity tolerance;Difficulty walking;Decreased strength;Decreased  balance Rehab Potential: Poor PT Plan: D/C due to limitied improvement.    Goals Home Exercise Program Pt will Perform Home Exercise Program: Independently PT Short Term Goals PT Short Term Goal 1: Goal unmet pain still goes as high as a 10/10 PT Short Term Goal 2: Pt is able to walk on even ground for 30 minutes now; goal was 20 MET PT Short Term Goal 3: Strength is 2/5 (with mm test although exercises patient is doing would require at least 3/5 strength) inital eval was 2-/5   Goal met PT Short Term Goal 4: SLS on R has increased to 8 seconds but L remains at 3. Goal not met PT Long Term Goals PT Long Term Goal 1: pain level not met PT Long Term Goal 2: able to walk for 30 minutes but with pain Long Term Goal 3: sterngth is not wnl  Problem List Patient Active Problem List  Diagnosis  . HYPERTENSION  . SUPRAVENTRICULAR TACHYCARDIA  . Lumbar spondylosis  . Back pain  . Difficulty in walking  . Stiffness of joint, not elsewhere classified, ankle and foot  . Weakness of both legs    PT - End of Session Activity Tolerance: Patient tolerated treatment well General Behavior During Session: [facility] for tasks performed Cognition: [facility] for tasks performed PT Plan of Care PT Home Exercise Plan: given new program for T-band   GP Functional Assessment Tool Used: LEFS Functional Limitation: Mobility: Walking and moving around Mobility: Walking and Moving Around Goal Status [id number]): At least 20 percent but less than 40 percent impaired, limited or restricted Mobility: Walking and Moving Around Discharge Status [phone]): At least 40 percent but less than 60 percent impaired, limited or restricted  Torey Reinard,CINDY 11/18/2011, 11:21 AM  Physician Documentation Your signature is required to indicate approval of the treatment plan as stated above.  Please sign and either send electronically or make a copy of this report for your files and return this physician signed original.   Please mark one 1.__approve of plan  2. ___approve of plan with the following conditions.   ______________________________                                                          _____________________ Physician Signature                                                                                                             Date

## 2012-03-09 ENCOUNTER — Other Ambulatory Visit (HOSPITAL_COMMUNITY): Payer: Self-pay | Admitting: Family Medicine

## 2012-03-09 DIAGNOSIS — K759 Inflammatory liver disease, unspecified: Secondary | ICD-10-CM

## 2012-03-14 ENCOUNTER — Ambulatory Visit (HOSPITAL_COMMUNITY)
Admission: RE | Admit: 2012-03-14 | Discharge: 2012-03-14 | Disposition: A | Discharge status: Home / Self Care | Payer: Medicare Other | Source: Ambulatory Visit | Attending: Family Medicine | Admitting: Family Medicine

## 2012-03-14 DIAGNOSIS — R932 Abnormal findings on diagnostic imaging of liver and biliary tract: Secondary | ICD-10-CM | POA: Insufficient documentation

## 2012-03-14 DIAGNOSIS — K759 Inflammatory liver disease, unspecified: Secondary | ICD-10-CM | POA: Insufficient documentation

## 2012-03-18 ENCOUNTER — Other Ambulatory Visit (HOSPITAL_COMMUNITY): Payer: Self-pay | Admitting: Family Medicine

## 2012-03-18 DIAGNOSIS — R932 Abnormal findings on diagnostic imaging of liver and biliary tract: Secondary | ICD-10-CM

## 2012-03-22 ENCOUNTER — Encounter (HOSPITAL_COMMUNITY): Payer: Medicare Other

## 2012-03-24 ENCOUNTER — Encounter (HOSPITAL_COMMUNITY): Payer: Self-pay

## 2012-03-24 ENCOUNTER — Encounter (HOSPITAL_COMMUNITY)
Admission: RE | Admit: 2012-03-24 | Discharge: 2012-03-24 | Disposition: A | Discharge status: Home / Self Care | Payer: Medicare Other | Source: Ambulatory Visit | Attending: Family Medicine | Admitting: Family Medicine

## 2012-03-24 DIAGNOSIS — R932 Abnormal findings on diagnostic imaging of liver and biliary tract: Secondary | ICD-10-CM

## 2012-03-24 DIAGNOSIS — R109 Unspecified abdominal pain: Secondary | ICD-10-CM | POA: Insufficient documentation

## 2012-03-24 MED ORDER — TECHNETIUM TC 99M MEBROFENIN IV KIT
5.0000 | PACK | Freq: Once | INTRAVENOUS | Status: AC | PRN
  Administered 2012-03-24: 5 via INTRAVENOUS

## 2012-04-11 ENCOUNTER — Other Ambulatory Visit: Payer: Self-pay | Admitting: Family Medicine

## 2012-04-11 DIAGNOSIS — M545 Low back pain: Secondary | ICD-10-CM

## 2012-04-19 ENCOUNTER — Other Ambulatory Visit: Payer: Medicare Other

## 2012-04-27 ENCOUNTER — Other Ambulatory Visit: Payer: Medicare Other

## 2012-06-14 ENCOUNTER — Encounter: Payer: Self-pay | Admitting: Diagnostic Neuroimaging

## 2012-06-14 ENCOUNTER — Ambulatory Visit (INDEPENDENT_AMBULATORY_CARE_PROVIDER_SITE_OTHER): Payer: Medicaid Other | Admitting: Diagnostic Neuroimaging

## 2012-06-14 VITALS — BP 134/80 | HR 74 | Temp 97.6°F | Ht 63.5 in | Wt 153.0 lb

## 2012-06-14 DIAGNOSIS — G609 Hereditary and idiopathic neuropathy, unspecified: Secondary | ICD-10-CM

## 2012-06-14 DIAGNOSIS — G629 Polyneuropathy, unspecified: Secondary | ICD-10-CM

## 2012-06-14 MED ORDER — GABAPENTIN 600 MG PO TABS: 600.0000 mg | ORAL_TABLET | Freq: Three times a day (TID) | ORAL | Status: DC

## 2012-06-14 NOTE — Patient Instructions (Signed)
Gradually increase gabapentin up to 600 mg 3 times per day.

## 2012-06-14 NOTE — Progress Notes (Signed)
[facility]  PATIENT: Mackenzie Black DOB: 1955-10-23  REFERRING CLINICIAN:  HISTORY FROM: patient REASON FOR VISIT: follow up   HISTORICAL  CHIEF COMPLAINT:  Chief Complaint  Patient presents with  . Pain    both feet    HISTORY OF PRESENT ILLNESS:   UPDATE 06/14/12: Since last visit, tried neuropathy creams which seem to help a little bit, but benefit only lasted 30-45 minutes. Patient tried this for several weeks and then stopped it. Also since last visit, diagnosed with rheumatoid arthritis and started on methotrexate. After 2 weeks her liver function testing was abnormal and therefore she stopped taking this. Patient has had persistent elevated liver function testing, and was also taken off of statin. Patient has followup with GI specialist now. She is very concerned about her liver function abnormalities. She is concerned about taking any additional medications. She is considering nerve block treatments to her feet for neuropathy.  UPDATE 10/13/11: No relief with increase in gabapentin 300mg  TID.  Continues to have severe pain to her feet.  Tolerating gabapentin well as long as she takes 1 capsule, increasing dose makes her sleepy.  She has sweling to her ankles and feet.  No exercise.  She plans to get a nerve block to her feet by Dr. Benny Lennert.    PRIOR HPI (07/28/11 Dr. Marjory Lies): 57 year old right-handed female with history of back pain, arthritis, depression, anxiety, here for evaluation of neuropathy.  Since 2000, patient has had pins and needles, hot burning sensation in the bottom of her feet. Over last few years the pain is significantly worsened. Pain is aggravated by walking and physical activity. She has is intermittent swelling in her feet and ankles. Symptoms are fairly symmetric. Occasionally has numbness and burning that goes up to the ankles and calves. She also has significant low back pain. No problems with her arms or neck.  REVIEW OF SYSTEMS:  Full 14 system review of systems performed and notable only for numbness and burning in the feet  ALLERGIES: No Known Allergies  HOME MEDICATIONS: No outpatient prescriptions prior to visit.   No facility-administered medications prior to visit.    PAST MEDICAL HISTORY: Past Medical History  Diagnosis Date  . Neuropathy     PAST SURGICAL HISTORY: Past Surgical History  Procedure Laterality Date  . Tonsillectomy    . Excision of cyst of hand    . Tubal ligation    . Knee surgery    . Bilateral foot surgery    . Colonoscopy  03/31/2011    Procedure: COLONOSCOPY;  Surgeon: Dalia Heading, MD;  Location: [facility];  Service: Gastroenterology;  Laterality: N/A;    FAMILY HISTORY: Family History  Problem Relation Age of Onset  . Colon cancer Sister   . Colon cancer Brother   . Stroke Mother   . Cancer - Lung Father     SOCIAL HISTORY:  History   Social History  . Marital Status: Single    Spouse Name: N/A    Number of Children: N/A  . Years of Education: N/A   Occupational History  . Not on file.   Social History Main Topics  . Smoking status: Never Smoker   . Smokeless tobacco: Not on file  . Alcohol Use: No  . Drug Use: No  . Sexually Active:    Other Topics Concern  . Not on file   Social History Narrative  . No narrative on file     PHYSICAL EXAM  Filed Vitals:   06/14/12 1516  BP: 134/80  Pulse: 74  Temp: 97.6 F (36.4 C)  TempSrc: Oral  Height: 5' 3.5" (1.613 m)  Weight: 153 lb (69.4 kg)   Body mass index is 26.67 kg/(m^2).  GENERAL EXAM: Patient is in no distress  CARDIOVASCULAR: Regular rate and rhythm, no murmurs, no carotid bruits  NEUROLOGIC: MENTAL STATUS: awake, alert, language fluent, comprehension intact, naming intact CRANIAL NERVE: visual fields full to confrontation, extraocular muscles intact, no nystagmus, facial sensation and strength symmetric, uvula midline, shoulder shrug symmetric, tongue midline. MOTOR:  normal bulk and tone, full strength in the BUE, BLE SENSORY: ABSENT VIB AT TOES. DECR PP IN FEET UP TO KNEES. COORDINATION: finger-nose-finger, fine finger movements normal REFLEXES: TRACE AT KNEES. ABSENT AT ANKLES. GAIT/STATION: CAUTIOUS GAIT.   DIAGNOSTIC DATA (LABS, IMAGING, TESTING) - I reviewed patient records, labs, notes, testing and imaging myself where available.  Lab Results  Component Value Date   WBC 9.9 06/16/2007   HGB 14.5 06/16/2007   HCT 42.3 06/16/2007   MCV 89.0 06/16/2007   PLT 301 06/16/2007      Component Value Date/Time   NA 137 06/16/2007 1642   K 4.7 06/16/2007 1642   CL 101 06/16/2007 1642   CO2 27 06/16/2007 1642   GLUCOSE 105* 06/16/2007 1642   BUN 13 06/16/2007 1642   CREATININE 0.76 06/16/2007 1642   CALCIUM 9.7 06/16/2007 1642   PROT 8.1 10/19/2006 1402   ALBUMIN 4.4 10/19/2006 1402   AST 30 10/19/2006 1402   ALT 43* 10/19/2006 1402   ALKPHOS 154* 10/19/2006 1402   BILITOT 0.4 10/19/2006 1402   GFRNONAA >60 06/16/2007 1642   GFRAA  Value: >60        The eGFR has been calculated using the MDRD equation. This calculation has not been validated in all clinical 06/16/2007 1642   No results found for this basename: CHOL, HDL, LDLCALC, LDLDIRECT, TRIG, CHOLHDL   No results found for this basename: HGBA1C   No results found for this basename: VITAMINB12   No results found for this basename: TSH    05/26/11 MRI lumbar spine - mild degen changes; no spinal stenosis or foraminal narrowing.   07/28/11 Vitamin B12, HgbA1c, and TSH normal  10/27/11 EMG/NCS - This is an equivocal study. Decreased motor unit recruitment noted in the right gastrocnemius muscles, may represent chronic denervation and reinnervation changes. Overall, above finding does not account for the patient's symptoms. Given the clinical context, she may have a small fiber neuropathy which cannot be detected using this testing.    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  PLAN: 1. continue gabapentin gradually (now 300mg  BID; can increase to 600mg  TID as tolerated)   Suanne Marker, MD 06/14/2012, 4:14 PM Certified in Neurology, Neurophysiology and Neuroimaging  [facility] [address], [address] [place], [place] [address] [phone]

## 2012-06-27 ENCOUNTER — Ambulatory Visit (INDEPENDENT_AMBULATORY_CARE_PROVIDER_SITE_OTHER): Payer: Medicare Other | Admitting: Internal Medicine

## 2012-06-27 ENCOUNTER — Other Ambulatory Visit (INDEPENDENT_AMBULATORY_CARE_PROVIDER_SITE_OTHER): Payer: Self-pay | Admitting: Internal Medicine

## 2012-06-27 ENCOUNTER — Encounter (INDEPENDENT_AMBULATORY_CARE_PROVIDER_SITE_OTHER): Payer: Self-pay | Admitting: Internal Medicine

## 2012-06-27 VITALS — BP 108/74 | HR 64 | Ht 65.0 in | Wt 151.3 lb

## 2012-06-27 DIAGNOSIS — R748 Abnormal levels of other serum enzymes: Secondary | ICD-10-CM | POA: Insufficient documentation

## 2012-06-27 DIAGNOSIS — K8020 Calculus of gallbladder without cholecystitis without obstruction: Secondary | ICD-10-CM

## 2012-06-27 LAB — CBC WITH DIFFERENTIAL/PLATELET
Basophils Absolute: 0.1 10*3/uL (ref 0.0–0.1)
Basophils Relative: 1 % (ref 0–1)
Hemoglobin: 14.3 g/dL (ref 12.0–15.0)
MCHC: 33.4 g/dL (ref 30.0–36.0)
Neutro Abs: 7 10*3/uL (ref 1.7–7.7)
Neutrophils Relative %: 66 % (ref 43–77)
RDW: 12.4 % (ref 11.5–15.5)

## 2012-06-27 NOTE — Progress Notes (Addendum)
Subjective:     Patient ID: Mackenzie Black, female   DOB: Oct 08, 1955, 57 y.o.   MRN: [id number]  HPIReferred to our office by Dr. Sudie Bailey for elevated liver enzymes.L List of medications that are on hold is below. Hx of RA and was on Methotrexate x 2 weeks and stopped due to elevated liver enzymes. She says Dr. Nickola Major in [place] for her RA. There is really know abdominal pain. Appetite is good. No weight loss. She occasionally has a headache. No tattoos. No IV drug use.  No etoh since 2006.(Not heavy). No family hx of liver disease. Family hx of colon cancer and rectal cancer.  Takes a BC Powder daily x 1.  Her liver enzymes were normal 11/2011  The medications below have been on hold. Atorvastatin 20mg  on hold (09/22/2011-03/09/2012) Folic acid 1mg  on hold (12/21/2011-12/30/2011) Diclofenac on hold (Jun 22, 2011-12/2011) Methotrexate on hold  (12/21/2011-12/30/2011)  06/07/12 ALP 194, AST 72, ALT 104. Total bili 0.5 03/07/2012 ALP 211, AST 60, ALT 163 02/04/2011 ALP 137, AST 32, ALT 37   01/25/2012 Hept A,B,C negative  03/18/12 HIDA SCAN: IMPRESSION:  Cystic and common bile ducts are patent. Gallbladder ejection  fraction is within normal limits.   03/09/2012 US abdomen: 1. The appearance of the liver is unremarkable.  2. Multiple small immobile echogenic but nonshadowing foci in the  gallbladder, as above. Differential considerations strongly favor  gallbladder polyps, but also include adherent nonshadowing  gallstones, or less likely neoplasm. Repeat right upper quadrant  abdominal ultrasound in 3 months is recommended to ensure this  stability or resolution of this finding.  Takes BC Powder daily x 1.    Review of Systems see hpi Current Outpatient Prescriptions  Medication Sig Dispense Refill  . Aspirin-Salicylamide-Caffeine (BC HEADACHE POWDER PO) Take by mouth.      . gabapentin (NEURONTIN) 600 MG tablet Take 1 tablet (600 mg total) by mouth 3 (three) times daily.   90 tablet  12  . hydrochlorothiazide (HYDRODIURIL) 25 MG tablet       . loratadine (CLARITIN) 10 MG tablet Take 10 mg by mouth daily.      . mupirocin ointment (BACTROBAN) 2 % Apply 1 application topically as needed.        No current facility-administered medications for this visit.   Past Medical History  Diagnosis Date  . Neuropathy   . RA (rheumatoid arthritis)   . Back pain   . Hypertension   . Hypertension    Past Surgical History  Procedure Laterality Date  . Tonsillectomy    . Excision of cyst of hand    . Tubal ligation    . Knee surgery    . Bilateral foot surgery    . Colonoscopy  03/31/2011    Procedure: COLONOSCOPY;  Surgeon: Dalia Heading, MD;  Location: [facility];  Service: Gastroenterology;  Laterality: N/A;   No Known Allergies      Objective:   Physical Exam  Filed Vitals:   06/27/12 1437  BP: 108/74  Pulse: 64  Height: 5\' 5"  (1.651 m)  Weight: 151 lb 4.8 oz (68.629 kg)   Alert and oriented. Skin warm and dry. Oral mucosa is moist.   . Sclera anicteric, conjunctivae is pink. Thyroid not enlarged. No cervical lymphadenopathy. Lungs clear. Heart regular rate and rhythm.  Abdomen is soft. Bowel sounds are positive. No hepatomegaly. No abdominal masses felt. No tenderness.  No edema to lower extremities.     Red rash to lower  extremities. Assessment:    Elevated liver enzymes. Hep A,B,C negative. Auto immune process needs to be ruled out.  Diclofenac, Atorvastatin, methotrexate on hold.    Plan:    CBC, Ferritin, ANA, SMA, Ceruplasmin, Alpha 1 antitrypsin, CRP. OV in 2 months. US abdomen with attention to liver and gallbladder.

## 2012-06-27 NOTE — Patient Instructions (Addendum)
Ferritin, SMA, ANA, Iron,US abdomen

## 2012-06-28 LAB — CERULOPLASMIN: Ceruloplasmin: 34 mg/dL (ref 20–60)

## 2012-06-28 LAB — ALPHA-1-ANTITRYPSIN: A-1 Antitrypsin, Ser: 171 mg/dL (ref 90–200)

## 2012-06-28 LAB — COMPREHENSIVE METABOLIC PANEL
AST: 28 U/L (ref 0–37)
Albumin: 4.5 g/dL (ref 3.5–5.2)
Alkaline Phosphatase: 119 U/L — ABNORMAL HIGH (ref 39–117)
Potassium: 3.5 mEq/L (ref 3.5–5.3)
Sodium: 141 mEq/L (ref 135–145)
Total Protein: 7.3 g/dL (ref 6.0–8.3)

## 2012-07-04 ENCOUNTER — Ambulatory Visit (HOSPITAL_COMMUNITY)
Admission: RE | Admit: 2012-07-04 | Discharge: 2012-07-04 | Disposition: A | Discharge status: Home / Self Care | Payer: Medicare Other | Source: Ambulatory Visit | Attending: Internal Medicine | Admitting: Internal Medicine

## 2012-07-04 ENCOUNTER — Other Ambulatory Visit (INDEPENDENT_AMBULATORY_CARE_PROVIDER_SITE_OTHER): Payer: Self-pay | Admitting: Internal Medicine

## 2012-07-04 DIAGNOSIS — K8020 Calculus of gallbladder without cholecystitis without obstruction: Secondary | ICD-10-CM

## 2012-07-04 DIAGNOSIS — R748 Abnormal levels of other serum enzymes: Secondary | ICD-10-CM

## 2012-07-05 ENCOUNTER — Telehealth (INDEPENDENT_AMBULATORY_CARE_PROVIDER_SITE_OTHER): Payer: Self-pay | Admitting: *Deleted

## 2012-07-05 NOTE — Telephone Encounter (Signed)
.  Per Terri Setzer,NP 

## 2012-07-06 NOTE — Progress Notes (Signed)
Apt has been scheduled for 08/29/12 at 1:45 pm with Dorene Ar, NP.

## 2012-07-07 ENCOUNTER — Encounter (INDEPENDENT_AMBULATORY_CARE_PROVIDER_SITE_OTHER): Payer: Self-pay

## 2012-07-14 ENCOUNTER — Encounter (INDEPENDENT_AMBULATORY_CARE_PROVIDER_SITE_OTHER): Payer: Self-pay | Admitting: *Deleted

## 2012-07-14 ENCOUNTER — Other Ambulatory Visit (INDEPENDENT_AMBULATORY_CARE_PROVIDER_SITE_OTHER): Payer: Self-pay | Admitting: *Deleted

## 2012-08-22 ENCOUNTER — Other Ambulatory Visit (HOSPITAL_COMMUNITY): Payer: Self-pay | Admitting: Family Medicine

## 2012-08-22 DIAGNOSIS — Z139 Encounter for screening, unspecified: Secondary | ICD-10-CM

## 2012-08-23 LAB — COMPREHENSIVE METABOLIC PANEL
ALT: 29 U/L (ref 0–35)
Alkaline Phosphatase: 110 U/L (ref 39–117)
CO2: 30 mEq/L (ref 19–32)
Sodium: 140 mEq/L (ref 135–145)
Total Bilirubin: 0.5 mg/dL (ref 0.3–1.2)
Total Protein: 7 g/dL (ref 6.0–8.3)

## 2012-08-24 ENCOUNTER — Telehealth (INDEPENDENT_AMBULATORY_CARE_PROVIDER_SITE_OTHER): Payer: Self-pay | Admitting: *Deleted

## 2012-08-24 NOTE — Telephone Encounter (Signed)
Results given to patient

## 2012-08-24 NOTE — Telephone Encounter (Signed)
I have read this message. I have not called Mackenzie Black.

## 2012-08-24 NOTE — Telephone Encounter (Signed)
Received call this from our office not sure who or why.  She wants [place] to contact her at [phone].

## 2012-08-29 ENCOUNTER — Encounter (INDEPENDENT_AMBULATORY_CARE_PROVIDER_SITE_OTHER): Payer: Self-pay | Admitting: Internal Medicine

## 2012-08-29 ENCOUNTER — Ambulatory Visit (INDEPENDENT_AMBULATORY_CARE_PROVIDER_SITE_OTHER): Payer: Medicare Other | Admitting: Internal Medicine

## 2012-08-29 VITALS — BP 112/78 | HR 64 | Temp 98.1°F | Ht 65.0 in | Wt 151.5 lb

## 2012-08-29 NOTE — Progress Notes (Signed)
Subjective:     Patient ID: Mackenzie Black, female   DOB: [birth year], 57 y.o.   MRN: [id number]  HPI Here today for f/u of her elevated liver enzymes. All her liver enzymes. I discussed with Dr. Karilyn Cota. Patient may go back on her Methotrexate.  She tells me she has been doing good. She is seeing Dr. Lendon Colonel (RA) in [place]. Appetite is good. No weight loss. No abdominal pain. Usually has a BM about every couple days. Occasionally see blood when she is constipated. Family hx of colon cancer and rectal cancer in a sister and a brother.  CMP     Component Value Date/Time   NA 140 08/23/2012 0859   K 3.8 08/23/2012 0859   CL 101 08/23/2012 0859   CO2 30 08/23/2012 0859   GLUCOSE 90 08/23/2012 0859   BUN 13 08/23/2012 0859   CREATININE 0.87 08/23/2012 0859   CREATININE 0.76 06/16/2007 1642   CALCIUM 9.9 08/23/2012 0859   PROT 7.0 08/23/2012 0859   ALBUMIN 4.2 08/23/2012 0859   AST 24 08/23/2012 0859   ALT 29 08/23/2012 0859   ALKPHOS 110 08/23/2012 0859   BILITOT 0.5 08/23/2012 0859   GFRNONAA >60 06/16/2007 1642   GFRAA  Value: >60        The eGFR has been calculated using the MDRD equation. This calculation has not been validated in all clinical 06/16/2007 1642       The medications below have been on hold.  Atorvastatin 20mg  on hold (09/22/2011-03/09/2012)  Folic acid 1mg  on hold (12/21/2011-12/30/2011)  Diclofenac on hold (Jun 22, 2011-12/2011)  Methotrexate on hold (12/21/2011-12/30/2011)    06/07/12 ALP 194, AST 72, ALT 104. Total bili 0.5  03/07/2012 ALP 211, AST 60, ALT 163  02/04/2011 ALP 137, AST 32, ALT 37   06/27/2012 US abdomen: IMPRESSION:  Cholelithiasis without evidence of cholecystitis.   03/31/2011 Colonoscopy:  FINDINGS: Tortuous sigmoid colon noted. A normal appearing  cecum, ileocecal valve, and appendiceal orifice were identified.  The ascending, hepatic flexure, transverse, splenic flexure,  descending, sigmoid colon, and rectum appeared unremarkable.  Retroflexed views in the rectum  revealed it was not tolerated by the  patient. The scope was then withdrawn from the cecum and the  procedure completed.  COMPLICATIONS: None  ENDOSCOPIC IMPRESSION:   Review of Systems  See hpi Current Outpatient Prescriptions  Medication Sig Dispense Refill  . Aspirin-Salicylamide-Caffeine (BC HEADACHE POWDER PO) Take by mouth.      . gabapentin (NEURONTIN) 600 MG tablet Take 600 mg by mouth 2 (two) times daily.      . hydrochlorothiazide (HYDRODIURIL) 25 MG tablet       . loratadine (CLARITIN) 10 MG tablet Take 10 mg by mouth daily.      . mupirocin ointment (BACTROBAN) 2 % Apply 1 application topically as needed.        No current facility-administered medications for this visit.   Past Medical History  Diagnosis Date  . Neuropathy   . RA (rheumatoid arthritis)   . Back pain   . Hypertension   . Hypertension    No Known Allergies        Objective:   Physical Exam  Filed Vitals:   08/29/12 1355  BP: 112/78  Pulse: 64  Temp: 98.1 F (36.7 C)  Height: 5\' 5"  (1.651 m)  Weight: 151 lb 8 oz (68.72 kg)   Alert and oriented. Skin warm and dry. Oral mucosa is moist.   . Sclera anicteric, conjunctivae is  pink. Thyroid not enlarged. No cervical lymphadenopathy. Lungs clear. Heart regular rate and rhythm.  Abdomen is soft. Bowel sounds are positive. No hepatomegaly. No abdominal masses felt. No tenderness.  No edema to lower extremities      Assessment:   Elevated transaminases which are now normal. Normal [place] except for gallstones.    Plan:    OV in 6 months with a CMET.Continue to hold atorvastatin.

## 2012-08-29 NOTE — Patient Instructions (Addendum)
OV in 6 months with a CMET 

## 2012-08-31 ENCOUNTER — Telehealth (INDEPENDENT_AMBULATORY_CARE_PROVIDER_SITE_OTHER): Payer: Self-pay | Admitting: *Deleted

## 2012-08-31 NOTE — Telephone Encounter (Signed)
She needs to have Rhematology physician send her labs to our office. She tells me her liver enzymes were elevated yesterday. They have been normal at our office.

## 2012-08-31 NOTE — Telephone Encounter (Signed)
Would like to talk with Mackenzie Black about her blood work. She had blood work at Dr. Scharlene Gloss office which showed something different. Her return phone number is [phone].

## 2012-08-31 NOTE — Telephone Encounter (Signed)
Wrong number.

## 2012-09-07 ENCOUNTER — Telehealth (INDEPENDENT_AMBULATORY_CARE_PROVIDER_SITE_OTHER): Payer: Self-pay | Admitting: *Deleted

## 2012-09-07 NOTE — Telephone Encounter (Signed)
.  Per Delrae Rend patient to have lab work drawn prior to appointment on 03-01-13.

## 2012-09-19 ENCOUNTER — Ambulatory Visit (HOSPITAL_COMMUNITY)
Admission: RE | Admit: 2012-09-19 | Discharge: 2012-09-19 | Disposition: A | Discharge status: Home / Self Care | Payer: Medicare Other | Source: Ambulatory Visit | Attending: Family Medicine | Admitting: Family Medicine

## 2012-09-19 DIAGNOSIS — Z1231 Encounter for screening mammogram for malignant neoplasm of breast: Secondary | ICD-10-CM | POA: Insufficient documentation

## 2012-09-19 DIAGNOSIS — Z139 Encounter for screening, unspecified: Secondary | ICD-10-CM

## 2012-09-22 ENCOUNTER — Other Ambulatory Visit: Payer: Self-pay | Admitting: Family Medicine

## 2012-09-22 DIAGNOSIS — R928 Other abnormal and inconclusive findings on diagnostic imaging of breast: Secondary | ICD-10-CM

## 2012-09-24 ENCOUNTER — Encounter (HOSPITAL_COMMUNITY): Payer: Self-pay | Admitting: *Deleted

## 2012-09-24 ENCOUNTER — Emergency Department (HOSPITAL_COMMUNITY)
Admission: EM | Admit: 2012-09-24 | Discharge: 2012-09-24 | Disposition: A | Discharge status: Home / Self Care | Payer: Medicare Other | Attending: Emergency Medicine | Admitting: Emergency Medicine

## 2012-09-24 ENCOUNTER — Emergency Department (HOSPITAL_COMMUNITY): Payer: Medicare Other

## 2012-09-24 DIAGNOSIS — G589 Mononeuropathy, unspecified: Secondary | ICD-10-CM | POA: Insufficient documentation

## 2012-09-24 DIAGNOSIS — E876 Hypokalemia: Secondary | ICD-10-CM | POA: Insufficient documentation

## 2012-09-24 DIAGNOSIS — R51 Headache: Secondary | ICD-10-CM | POA: Insufficient documentation

## 2012-09-24 DIAGNOSIS — K5289 Other specified noninfective gastroenteritis and colitis: Secondary | ICD-10-CM | POA: Insufficient documentation

## 2012-09-24 DIAGNOSIS — Z79899 Other long term (current) drug therapy: Secondary | ICD-10-CM | POA: Insufficient documentation

## 2012-09-24 DIAGNOSIS — M069 Rheumatoid arthritis, unspecified: Secondary | ICD-10-CM | POA: Insufficient documentation

## 2012-09-24 DIAGNOSIS — R5381 Other malaise: Secondary | ICD-10-CM | POA: Insufficient documentation

## 2012-09-24 DIAGNOSIS — Z9889 Other specified postprocedural states: Secondary | ICD-10-CM | POA: Insufficient documentation

## 2012-09-24 DIAGNOSIS — K529 Noninfective gastroenteritis and colitis, unspecified: Secondary | ICD-10-CM

## 2012-09-24 DIAGNOSIS — R079 Chest pain, unspecified: Secondary | ICD-10-CM | POA: Insufficient documentation

## 2012-09-24 DIAGNOSIS — Z9851 Tubal ligation status: Secondary | ICD-10-CM | POA: Insufficient documentation

## 2012-09-24 DIAGNOSIS — R112 Nausea with vomiting, unspecified: Secondary | ICD-10-CM | POA: Insufficient documentation

## 2012-09-24 DIAGNOSIS — R5383 Other fatigue: Secondary | ICD-10-CM | POA: Insufficient documentation

## 2012-09-24 DIAGNOSIS — I10 Essential (primary) hypertension: Secondary | ICD-10-CM | POA: Insufficient documentation

## 2012-09-24 DIAGNOSIS — Z8739 Personal history of other diseases of the musculoskeletal system and connective tissue: Secondary | ICD-10-CM | POA: Insufficient documentation

## 2012-09-24 DIAGNOSIS — K921 Melena: Secondary | ICD-10-CM | POA: Insufficient documentation

## 2012-09-24 DIAGNOSIS — M549 Dorsalgia, unspecified: Secondary | ICD-10-CM | POA: Insufficient documentation

## 2012-09-24 DIAGNOSIS — R197 Diarrhea, unspecified: Secondary | ICD-10-CM | POA: Insufficient documentation

## 2012-09-24 LAB — CBC WITH DIFFERENTIAL/PLATELET
Basophils Absolute: 0 10*3/uL (ref 0.0–0.1)
Eosinophils Absolute: 0.1 10*3/uL (ref 0.0–0.7)
Lymphocytes Relative: 17 % (ref 12–46)
Lymphs Abs: 2.2 10*3/uL (ref 0.7–4.0)
Neutrophils Relative %: 75 % (ref 43–77)
Platelets: 288 10*3/uL (ref 150–400)
RBC: 5.08 MIL/uL (ref 3.87–5.11)
WBC: 13.1 10*3/uL — ABNORMAL HIGH (ref 4.0–10.5)

## 2012-09-24 LAB — URINALYSIS, ROUTINE W REFLEX MICROSCOPIC
Nitrite: NEGATIVE
Specific Gravity, Urine: <1.005 — ABNORMAL LOW (ref 1.005–1.030)
Urobilinogen, UA: 0.2 mg/dL (ref 0.0–1.0)

## 2012-09-24 LAB — COMPREHENSIVE METABOLIC PANEL
BUN: 11 mg/dL (ref 6–23)
Calcium: 9.5 mg/dL (ref 8.4–10.5)
GFR calc Af Amer: >90 mL/min (ref >90)
Glucose, Bld: 88 mg/dL (ref 70–99)
Sodium: 138 mEq/L (ref 135–145)
Total Protein: 7.7 g/dL (ref 6.0–8.3)

## 2012-09-24 LAB — LIPASE, BLOOD: Lipase: 24 U/L (ref 11–59)

## 2012-09-24 MED ORDER — IOHEXOL 300 MG/ML  SOLN
50.0000 mL | Freq: Once | INTRAMUSCULAR | Status: AC | PRN
  Administered 2012-09-24: 50 mL via ORAL

## 2012-09-24 MED ORDER — PROMETHAZINE HCL 25 MG/ML IJ SOLN
12.5000 mg | Freq: Once | INTRAMUSCULAR | Status: AC
  Administered 2012-09-24: 12.5 mg via INTRAVENOUS
  Filled 2012-09-24: qty 1

## 2012-09-24 MED ORDER — HYDROMORPHONE HCL PF 1 MG/ML IJ SOLN
0.5000 mg | Freq: Once | INTRAMUSCULAR | Status: AC
  Administered 2012-09-24: 0.5 mg via INTRAVENOUS
  Filled 2012-09-24: qty 1

## 2012-09-24 MED ORDER — ONDANSETRON 4 MG PO TBDP: 4.0000 mg | ORAL_TABLET | Freq: Three times a day (TID) | ORAL | Status: DC | PRN

## 2012-09-24 MED ORDER — POTASSIUM CHLORIDE 10 MEQ/100ML IV SOLN
10.0000 meq | Freq: Once | INTRAVENOUS | Status: AC
  Administered 2012-09-24: 10 meq via INTRAVENOUS
  Filled 2012-09-24: qty 100

## 2012-09-24 MED ORDER — POTASSIUM CHLORIDE CRYS ER 20 MEQ PO TBCR
40.0000 meq | EXTENDED_RELEASE_TABLET | Freq: Once | ORAL | Status: AC
  Administered 2012-09-24: 40 meq via ORAL
  Filled 2012-09-24: qty 2

## 2012-09-24 MED ORDER — HYDROCODONE-ACETAMINOPHEN 5-325 MG PO TABS: 1.0000 | ORAL_TABLET | Freq: Four times a day (QID) | ORAL | Status: DC | PRN

## 2012-09-24 MED ORDER — ONDANSETRON HCL 4 MG/2ML IJ SOLN
4.0000 mg | Freq: Once | INTRAMUSCULAR | Status: AC
  Administered 2012-09-24: 4 mg via INTRAVENOUS
  Filled 2012-09-24: qty 2

## 2012-09-24 MED ORDER — SODIUM CHLORIDE 0.9 % IV BOLUS (SEPSIS)
1000.0000 mL | Freq: Once | INTRAVENOUS | Status: AC
  Administered 2012-09-24: 1000 mL via INTRAVENOUS

## 2012-09-24 MED ORDER — IOHEXOL 300 MG/ML  SOLN
100.0000 mL | Freq: Once | INTRAMUSCULAR | Status: AC | PRN
  Administered 2012-09-24: 100 mL via INTRAVENOUS

## 2012-09-24 MED ORDER — CIPROFLOXACIN HCL 500 MG PO TABS: 500.0000 mg | ORAL_TABLET | Freq: Two times a day (BID) | ORAL | Status: DC

## 2012-09-24 MED ORDER — CIPROFLOXACIN IN D5W 400 MG/200ML IV SOLN
400.0000 mg | Freq: Once | INTRAVENOUS | Status: AC
  Administered 2012-09-24: 400 mg via INTRAVENOUS
  Filled 2012-09-24: qty 200

## 2012-09-24 MED ORDER — LOPERAMIDE HCL 2 MG PO CAPS: 2.0000 mg | ORAL_CAPSULE | Freq: Four times a day (QID) | ORAL | Status: DC | PRN

## 2012-09-24 MED ORDER — SODIUM CHLORIDE 0.9 % IV SOLN
INTRAVENOUS | Status: DC
  Administered 2012-09-24: 1000 mL via INTRAVENOUS

## 2012-09-24 MED ORDER — POTASSIUM CHLORIDE CRYS ER 20 MEQ PO TBCR: 20.0000 meq | EXTENDED_RELEASE_TABLET | Freq: Two times a day (BID) | ORAL | Status: DC

## 2012-09-24 NOTE — ED Notes (Signed)
Patient given discharge instruction, verbalized understand. IV removed, band aid applied. Family member assisting with getting dressed, pt given apple sauce for the road, pt wanting to eat.  Patient ambulatory out of the department.

## 2012-09-24 NOTE — ED Notes (Signed)
Pt c/o sharp abd pain with n/v/d after eating a tv dinner last night, started noticing diarrhea with light pink blood mixed with stool around 4 this am,

## 2012-09-24 NOTE — ED Notes (Signed)
CRITICAL VALUE ALERT  Critical value received:  Potassium 2.7  Date of notification:  09/24/12  Time of notification:  1521  Critical value read back:yes  Nurse who received alert:  Tarri Glenn RN   MD notified (1st page): Dr Deretha Emory  Time of first page:  1522  MD notified (2nd page):  Time of second page:  Responding MD:  Dr Deretha Emory  Time MD responded:  [phone]

## 2012-09-24 NOTE — ED Provider Notes (Signed)
CSN: [id number]     Arrival date & time 09/24/12  1241 History    This chart was scribed for Shelda Jakes, MD,  by Ashley Jacobs, ED Scribe. The patient was seen in room APA03/APA03 and the patient's care was started at 1:43 PM.   First MD Initiated Contact with Patient 09/24/12 1314     Chief Complaint  Patient presents with  . Abdominal Pain   (Consider location/radiation/quality/duration/timing/severity/associated sxs/prior Treatment) The history is provided by the patient and medical records. No language interpreter was used.   Associated symptoms: chest pain, diarrhea, fatigue, nausea and vomiting   Associated symptoms: no chills, no cough, no dysuria and no shortness of breath   The history is provided by the patient and medical records. No language interpreter was used.  HPI:  HPI Comments: Mackenzie Black is a 57 y.o. female who presents to the Emergency Department complaining of sharp, waxing and waning abdominal pain an hour after eating TV dinner around 8 PM the night PTA. Pt mentioned that the present pain is 5/10 and when symptoms first appeared it was 10/10 and non radiating. With the onset of the abdominal pain she experienced the associated symptoms of nausea, vomiting and diarrhea until 3am. However around 4 am she began to notice pinkish blood in her stool with a watery consistency. Pt reports that the blood is now currently red.She mentions that she sporadically has blood after wiping after a BM. She denies any sick contact. She denies taking blood thinners. Currently she described the abdominal pain as an ache and mentions that the symptoms have resolved somewhat.   Past Medical History  Diagnosis Date  . Neuropathy   . RA (rheumatoid arthritis)   . Back pain   . Hypertension   . Hypertension    Past Surgical History  Procedure Laterality Date  . Tonsillectomy    . Excision of cyst of hand    . Tubal ligation    . Knee surgery    . Bilateral foot  surgery    . Colonoscopy  03/31/2011    Procedure: COLONOSCOPY;  Surgeon: Dalia Heading, MD;  Location: [facility];  Service: Gastroenterology;  Laterality: N/A;   Family History  Problem Relation Age of Onset  . Colon cancer Sister   . Colon cancer Brother   . Stroke Mother   . Cancer - Lung Father    History  Substance Use Topics  . Smoking status: Never Smoker   . Smokeless tobacco: Not on file  . Alcohol Use: No   OB History   Grav Para Term Preterm Abortions TAB SAB Ect Mult Living                 Review of Systems  Constitutional: Positive for fatigue. Negative for chills.  HENT: Negative for congestion and rhinorrhea.   Eyes: Negative for visual disturbance.  Respiratory: Negative for cough and shortness of breath.   Cardiovascular: Positive for chest pain.  Gastrointestinal: Positive for nausea, vomiting, abdominal pain, diarrhea and blood in stool.  Genitourinary: Negative for dysuria, difficulty urinating and dyspareunia.  Musculoskeletal: Positive for back pain and arthralgias. Negative for joint swelling.  Skin: Negative for rash.  Neurological: Positive for weakness and headaches. Negative for dizziness, syncope and light-headedness.  Hematological: Does not bruise/bleed easily.  All other systems reviewed and are negative.    Allergies  Review of patient's allergies indicates no known allergies.  Home Medications   Current Outpatient Rx  Name  Route  Sig  Dispense  Refill  . Aspirin-Salicylamide-Caffeine (BC HEADACHE POWDER PO)   Oral   Take 1 Package by mouth daily as needed (RA).          . gabapentin (NEURONTIN) 300 MG capsule   Oral   Take 600 mg by mouth 2 (two) times daily.         . hydrochlorothiazide (HYDRODIURIL) 25 MG tablet   Oral   Take 25 mg by mouth daily.          Marland Kitchen loratadine (CLARITIN) 10 MG tablet   Oral   Take 10 mg by mouth daily.         . mupirocin ointment (BACTROBAN) 2 %   Topical   Apply 1 application  topically 2 (two) times daily.          Marland Kitchen PRESCRIPTION MEDICATION   Topical   Apply topically 4 (four) times daily. Ketam/Bacl/Gaba/Amit/Cloni/Bupiv/Nife (Compounded Medication):  Apply 1-2 grams (pumps) to affected area 3-4 times daily.         . ciprofloxacin (CIPRO) 500 MG tablet   Oral   Take 1 tablet (500 mg total) by mouth 2 (two) times daily.   10 tablet   0   . HYDROcodone-acetaminophen (NORCO/VICODIN) 5-325 MG per tablet   Oral   Take 1-2 tablets by mouth every 6 (six) hours as needed for pain.   10 tablet   0   . loperamide (IMODIUM) 2 MG capsule   Oral   Take 1 capsule (2 mg total) by mouth 4 (four) times daily as needed for diarrhea or loose stools.   12 capsule   0   . ondansetron (ZOFRAN ODT) 4 MG disintegrating tablet   Oral   Take 1 tablet (4 mg total) by mouth every 8 (eight) hours as needed.   10 tablet   0   . potassium chloride SA (K-DUR,KLOR-CON) 20 MEQ tablet   Oral   Take 1 tablet (20 mEq total) by mouth 2 (two) times daily.   8 tablet   0    BP 125/94  Pulse 88  Temp(Src) 98.3 F (36.8 C) (Oral)  Resp 18  Ht 5\' 5"  (1.651 m)  Wt 150 lb (68.04 kg)  BMI 24.96 kg/m2  SpO2 99% Physical Exam  Nursing note and vitals reviewed. Constitutional: She is oriented to person, place, and time. She appears well-developed and well-nourished. No distress.  HENT:  Head: Normocephalic and atraumatic.  Eyes: Conjunctivae and EOM are normal. Pupils are equal, round, and reactive to light. Right eye exhibits no discharge. Left eye exhibits no discharge. No scleral icterus.  Neck: Normal range of motion. Neck supple.  Cardiovascular: Normal rate and regular rhythm.   Pulmonary/Chest: Breath sounds normal.  Bowel sounds are present but decreased  Abdominal: Soft. There is no tenderness. There is no rebound and no guarding.  Genitourinary:  Rectal exam no stool in vault trace heme positive faint pink coloration. No mass no external hemorrhoids no fissures.   Musculoskeletal: Normal range of motion. She exhibits no edema.  Lymphadenopathy:    She has no cervical adenopathy.  Neurological: She is alert and oriented to person, place, and time. No cranial nerve deficit. She exhibits normal muscle tone. Coordination normal.  Skin: Skin is warm. She is not diaphoretic.  Psychiatric: She has a normal mood and affect. Her behavior is normal.    ED Course  DIAGNOSTIC STUDIES: Oxygen Saturation is 99% on room air, normal by my interpretation.  COORDINATION OF CARE: 1:52 PM Discussed course of care with pt. Pt understands and agrees.    Procedures (including critical care time)  Labs Reviewed  COMPREHENSIVE METABOLIC PANEL - Abnormal; Notable for the following:    Potassium 2.7 (*)    All other components within normal limits  URINALYSIS, ROUTINE W REFLEX MICROSCOPIC - Abnormal; Notable for the following:    Specific Gravity, Urine <1.005 (*)    Ketones, ur 15 (*)    All other components within normal limits  CBC WITH DIFFERENTIAL - Abnormal; Notable for the following:    WBC 13.1 (*)    Hemoglobin 15.5 (*)    Neutro Abs 9.8 (*)    All other components within normal limits  LIPASE, BLOOD   Ct Abdomen Pelvis W Contrast  09/24/2012   *RADIOLOGY REPORT*  Clinical Data: Pain  CT ABDOMEN AND PELVIS WITH CONTRAST  Technique:  Multidetector CT imaging of the abdomen and pelvis was performed following the standard protocol during bolus administration of intravenous contrast.  Contrast: 50mL OMNIPAQUE IOHEXOL 300 MG/ML  SOLN, OMNIPAQUE IOHEXOL 300 MG/ML  SOLN  Comparison: None.  Findings: There is severe wall thickening and edema involving the wall of the distal transverse colon and wall of the descending colon.  The sigmoid colon and rectum are relatively unaffected. There is stranding in the fat along the affected segment of the colon. The celiac, SMA, and IMA are all grossly and widely patent.  Liver, gallbladder, spleen, pancreas, adrenal  glands, kidneys are within normal limits.  No free fluid.  No free intraperitoneal gas.  No pneumatosis.  No abscess.  Bladder, uterus, and adnexa are within normal limits.  Mild degenerative change of the lumbar spine.  No vertebral compression deformity.  IMPRESSION: There is marked wall thickening and edema of the distal transverse colon and descending colon with inflammatory change in the adjacent fat.  No perforation or abscess.  Differential diagnosis includes infectious colitis, inflammatory bowel disease, pseudomembranous colitis, and possibly ischemia.   Original Report Authenticated By: Jolaine Click, M.D.   Results for orders placed during the hospital encounter of 09/24/12  COMPREHENSIVE METABOLIC PANEL      Result Value Range   Sodium 138  135 - 145 mEq/L   Potassium 2.7 (*) 3.5 - 5.1 mEq/L   Chloride 98  96 - 112 mEq/L   CO2 27  19 - 32 mEq/L   Glucose, Bld 88  70 - 99 mg/dL   BUN 11  6 - 23 mg/dL   Creatinine, Ser 1.61  0.50 - 1.10 mg/dL   Calcium 9.5  8.4 - 09.6 mg/dL   Total Protein 7.7  6.0 - 8.3 g/dL   Albumin 3.8  3.5 - 5.2 g/dL   AST 20  0 - 37 U/L   ALT 18  0 - 35 U/L   Alkaline Phosphatase 104  39 - 117 U/L   Total Bilirubin 0.4  0.3 - 1.2 mg/dL   GFR calc non Af Amer >90  >90 mL/min   GFR calc Af Amer >90  >90 mL/min  LIPASE, BLOOD      Result Value Range   Lipase 24  11 - 59 U/L  URINALYSIS, ROUTINE W REFLEX MICROSCOPIC      Result Value Range   Color, Urine YELLOW  YELLOW   APPearance CLEAR  CLEAR   Specific Gravity, Urine <1.005 (*) 1.005 - 1.030   pH 6.5  5.0 - 8.0   Glucose, UA NEGATIVE  NEGATIVE  mg/dL   Hgb urine dipstick NEGATIVE  NEGATIVE   Bilirubin Urine NEGATIVE  NEGATIVE   Ketones, ur 15 (*) NEGATIVE mg/dL   Protein, ur NEGATIVE  NEGATIVE mg/dL   Urobilinogen, UA 0.2  0.0 - 1.0 mg/dL   Nitrite NEGATIVE  NEGATIVE   Leukocytes, UA NEGATIVE  NEGATIVE  CBC WITH DIFFERENTIAL      Result Value Range   WBC 13.1 (*) 4.0 - 10.5 K/uL   RBC 5.08  3.87 -  5.11 MIL/uL   Hemoglobin 15.5 (*) 12.0 - 15.0 g/dL   HCT 16.1  09.6 - 04.5 %   MCV 89.6  78.0 - 100.0 fL   MCH 30.5  26.0 - 34.0 pg   MCHC 34.1  30.0 - 36.0 g/dL   RDW 40.9  81.1 - 91.4 %   Platelets 288  150 - 400 K/uL   Neutrophils Relative % 75  43 - 77 %   Neutro Abs 9.8 (*) 1.7 - 7.7 K/uL   Lymphocytes Relative 17  12 - 46 %   Lymphs Abs 2.2  0.7 - 4.0 K/uL   Monocytes Relative 8  3 - 12 %   Monocytes Absolute 1.0  0.1 - 1.0 K/uL   Eosinophils Relative 1  0 - 5 %   Eosinophils Absolute 0.1  0.0 - 0.7 K/uL   Basophils Relative 0  0 - 1 %   Basophils Absolute 0.0  0.0 - 0.1 K/uL      1. Hypokalemia   2. Colitis     MDM   Patient history not inconsistent with possible food poisoning other than a little early onset from the last meal. CT scan shows inflammatory colitis of the transverse colon and descending colon. Patient has had some pink bloody bowel movements no staining of the tall order read. Rectal exam here chest pain color his age and no clots no gross blood. It was Hemoccult positive. Patient is troubled with nausea and abdominal cramping offered admission patient refuses admission she prefers to go home. Patient also had hypokalemia hypokalemia was treated initially with 10 mEq of potassium IV piggyback because of her abdominal discomfort and then she was also given 40 mEq by mouth. Patient will have close followup with her doctor Dr. Sudie Bailey. Patient will be discharged home with precautions. Patient was started on Cipro for possible infectious diarrhea no fever but does have a leukocytosis. Do not feel that her symptoms are consistent with ischemia pain is 9 out of proportion.    I personally performed the services described in this documentation, which was scribed in my presence. The recorded information has been reviewed and is accurate.   Shelda Jakes, MD 09/24/12 [phone]

## 2012-09-26 LAB — OCCULT BLOOD, POC DEVICE: Fecal Occult Bld: POSITIVE — AB

## 2012-10-05 ENCOUNTER — Ambulatory Visit (HOSPITAL_COMMUNITY)
Admission: RE | Admit: 2012-10-05 | Discharge: 2012-10-05 | Disposition: A | Discharge status: Home / Self Care | Payer: Medicare Other | Source: Ambulatory Visit | Attending: Family Medicine | Admitting: Family Medicine

## 2012-10-05 DIAGNOSIS — R928 Other abnormal and inconclusive findings on diagnostic imaging of breast: Secondary | ICD-10-CM

## 2012-10-05 DIAGNOSIS — Z1231 Encounter for screening mammogram for malignant neoplasm of breast: Secondary | ICD-10-CM | POA: Insufficient documentation

## 2013-01-26 ENCOUNTER — Encounter (INDEPENDENT_AMBULATORY_CARE_PROVIDER_SITE_OTHER): Payer: Self-pay | Admitting: *Deleted

## 2013-01-26 ENCOUNTER — Other Ambulatory Visit (INDEPENDENT_AMBULATORY_CARE_PROVIDER_SITE_OTHER): Payer: Self-pay | Admitting: *Deleted

## 2013-02-27 LAB — COMPREHENSIVE METABOLIC PANEL
ALT: 10 U/L (ref 0–35)
AST: 12 U/L (ref 0–37)
Albumin: 4.3 g/dL (ref 3.5–5.2)
Alkaline Phosphatase: 66 U/L (ref 39–117)
BUN: 16 mg/dL (ref 6–23)
CO2: 30 mEq/L (ref 19–32)
Calcium: 10 mg/dL (ref 8.4–10.5)
Chloride: 98 mEq/L (ref 96–112)
Creat: 0.8 mg/dL (ref 0.50–1.10)
Glucose, Bld: 81 mg/dL (ref 70–99)
Potassium: 4.2 mEq/L (ref 3.5–5.3)
Sodium: 139 mEq/L (ref 135–145)
Total Bilirubin: 0.5 mg/dL (ref 0.3–1.2)
Total Protein: 7.3 g/dL (ref 6.0–8.3)

## 2013-02-28 ENCOUNTER — Telehealth (INDEPENDENT_AMBULATORY_CARE_PROVIDER_SITE_OTHER): Payer: Self-pay | Admitting: Internal Medicine

## 2013-02-28 NOTE — Telephone Encounter (Signed)
Message left at home 

## 2013-03-01 ENCOUNTER — Ambulatory Visit (INDEPENDENT_AMBULATORY_CARE_PROVIDER_SITE_OTHER): Payer: Medicare Other | Admitting: Internal Medicine

## 2013-03-01 ENCOUNTER — Encounter (INDEPENDENT_AMBULATORY_CARE_PROVIDER_SITE_OTHER): Payer: Self-pay | Admitting: Internal Medicine

## 2013-03-01 VITALS — BP 104/71 | HR 80 | Temp 98.1°F | Ht 65.0 in | Wt 139.4 lb

## 2013-03-01 DIAGNOSIS — R7401 Elevation of levels of liver transaminase levels: Secondary | ICD-10-CM

## 2013-03-01 DIAGNOSIS — R740 Nonspecific elevation of levels of transaminase and lactic acid dehydrogenase [LDH]: Principal | ICD-10-CM

## 2013-03-01 NOTE — Progress Notes (Signed)
Subjective:     Patient ID: Mackenzie Black, female   DOB: June 11, 1955, 58 y.o.   MRN: [id number]  HPI Her today for f/u of elevated liver enzymes.  Recent lab work revealed normal liver enzymes. Methotrexate is on hold. She has been switched to Humira for her RA. She took her 1st Humira 40mg  shot in December. She developed a rash and her 2nd Humira was held.  Her rash has resolved and she tells me her Rhematologist will try again. (Dr. Milly Jakob) Appetite is good. She has weight loss which she says was intentional. (12 pounds). She has changed her diet.  She usually has a BM twice a week. No melena or bright red rectal bleeding. She says if she becomes constipated, she may have a little blood on a stool.  09/2012 CT abdomen/pelvis with CM: diarrhea, nausea and vomiting: ED. IMPRESSION:  There is marked wall thickening and edema of the distal transverse  colon and descending colon with inflammatory change in the adjacent  fat. No perforation or abscess. Differential diagnosis includes  infectious colitis, inflammatory bowel disease, pseudomembranous  colitis, and possibly ischemia.   Her liver enzymes were normal 11/2011  The medications below have been on hold.  Atorvastatin 20mg  on hold (4/0/9811-10/31/7827)  Folic acid 1mg  on hold (12/21/2011-12/30/2011)  Diclofenac on hold (Jun 22, 2011-12/2011)  Methotrexate on hold (12/21/2011-12/30/2011)  06/07/12 ALP 194, AST 72, ALT 104. Total bili 0.5  03/07/2012 ALP 211, AST 60, ALT 163  02/04/2011 ALP 137, AST 32, ALT 37  01/25/2012 Hept A,B,C negative  03/18/12 HIDA SCAN:  IMPRESSION:  Cystic and common bile ducts are patent. Gallbladder ejection  fraction is within normal limits.  03/09/2012 US abdomen: 1. The appearance of the liver is unremarkable.  2. Multiple small immobile echogenic but nonshadowing foci in the  gallbladder, as above. Differential considerations strongly favor  gallbladder polyps, but also include adherent nonshadowing   gallstones, or less likely neoplasm. Repeat right upper quadrant  abdominal ultrasound in 3 months is recommended to ensure this  stability or resolution of this finding.  Takes BC Powder daily x 1.   Review of Systems see hpi Current Outpatient Prescriptions  Medication Sig Dispense Refill  . Adalimumab (HUMIRA [place]) Inject into the skin.      . Aspirin-Salicylamide-Caffeine (BC HEADACHE POWDER PO) Take 1 Package by mouth daily as needed (RA).       . gabapentin (NEURONTIN) 300 MG capsule Take 600 mg by mouth 2 (two) times daily.      . hydrochlorothiazide (HYDRODIURIL) 25 MG tablet Take 25 mg by mouth daily.       Marland Kitchen HYDROcodone-acetaminophen (NORCO/VICODIN) 5-325 MG per tablet Take 1-2 tablets by mouth every 6 (six) hours as needed for pain.  10 tablet  0  . ondansetron (ZOFRAN ODT) 4 MG disintegrating tablet Take 1 tablet (4 mg total) by mouth every 8 (eight) hours as needed.  10 tablet  0  . PRESCRIPTION MEDICATION Apply topically 4 (four) times daily. Ketam/Bacl/Gaba/Amit/Cloni/Bupiv/Nife (Compounded Medication):  Apply 1-2 grams (pumps) to affected area 3-4 times daily.       No current facility-administered medications for this visit.   Past Medical History  Diagnosis Date  . Neuropathy   . RA (rheumatoid arthritis)   . Back pain   . Hypertension   . Hypertension    Past Surgical History  Procedure Laterality Date  . Tonsillectomy    . Excision of cyst of hand    . Tubal ligation    .  Knee surgery    . Bilateral foot surgery    . Colonoscopy  03/31/2011    Procedure: COLONOSCOPY;  Surgeon: Jamesetta So, MD;  Location: [facility];  Service: Gastroenterology;  Laterality: N/A;   Allergies  Allergen Reactions  . Humira [Adalimumab]     rash        Objective:   Physical Exam  Filed Vitals:   03/01/13 1402  BP: 104/71  Pulse: 80  Temp: 98.1 F (36.7 C)  Height: 5\' 5"  (1.651 m)  Weight: 139 lb 6.4 oz (63.231 kg)   Alert and oriented. Skin warm and dry.  Oral mucosa is moist.   . Sclera anicteric, conjunctivae is pink. Thyroid not enlarged. No cervical lymphadenopathy. Lungs clear. Heart regular rate and rhythm.  Abdomen is soft. Bowel sounds are positive. No hepatomegaly. No abdominal masses felt. No tenderness.  No edema to lower extremities.      Assessment:    Elevated liver enzymes probably due to the Methotrexate. Liver enzymes normal since being off. She presently is on Humira for her RA.    Plan:    OV in 6 months with Dr. Laural Golden. Cmet in 6 months

## 2013-03-01 NOTE — Patient Instructions (Signed)
OV in 6 months with Dr. Laural Golden. Cmet in 6 months.

## 2013-03-16 ENCOUNTER — Telehealth (INDEPENDENT_AMBULATORY_CARE_PROVIDER_SITE_OTHER): Payer: Self-pay | Admitting: *Deleted

## 2013-03-16 DIAGNOSIS — R7401 Elevation of levels of liver transaminase levels: Secondary | ICD-10-CM

## 2013-03-16 DIAGNOSIS — R740 Nonspecific elevation of levels of transaminase and lactic acid dehydrogenase [LDH]: Principal | ICD-10-CM

## 2013-03-16 NOTE — Telephone Encounter (Signed)
.  Per Lelon Perla patient is to have labs in 6 months. Patient has an appointment with Dr.Rehman 09/12/13.

## 2013-07-18 ENCOUNTER — Ambulatory Visit (HOSPITAL_COMMUNITY)
Admission: RE | Admit: 2013-07-18 | Discharge: 2013-07-18 | Disposition: A | Discharge status: Home / Self Care | Payer: Medicare Other | Source: Ambulatory Visit | Attending: Family Medicine | Admitting: Family Medicine

## 2013-07-18 ENCOUNTER — Other Ambulatory Visit (HOSPITAL_COMMUNITY): Payer: Self-pay | Admitting: Family Medicine

## 2013-07-18 DIAGNOSIS — R059 Cough, unspecified: Secondary | ICD-10-CM | POA: Insufficient documentation

## 2013-07-18 DIAGNOSIS — R05 Cough: Secondary | ICD-10-CM

## 2013-07-18 DIAGNOSIS — Z87891 Personal history of nicotine dependence: Secondary | ICD-10-CM | POA: Insufficient documentation

## 2013-08-02 ENCOUNTER — Other Ambulatory Visit (INDEPENDENT_AMBULATORY_CARE_PROVIDER_SITE_OTHER): Payer: Self-pay | Admitting: *Deleted

## 2013-08-02 ENCOUNTER — Encounter (INDEPENDENT_AMBULATORY_CARE_PROVIDER_SITE_OTHER): Payer: Self-pay | Admitting: *Deleted

## 2013-08-02 DIAGNOSIS — R7401 Elevation of levels of liver transaminase levels: Secondary | ICD-10-CM

## 2013-08-02 DIAGNOSIS — R740 Nonspecific elevation of levels of transaminase and lactic acid dehydrogenase [LDH]: Principal | ICD-10-CM

## 2013-09-01 ENCOUNTER — Other Ambulatory Visit (HOSPITAL_COMMUNITY): Payer: Self-pay | Admitting: Family Medicine

## 2013-09-01 DIAGNOSIS — Z139 Encounter for screening, unspecified: Secondary | ICD-10-CM

## 2013-09-01 DIAGNOSIS — Z1231 Encounter for screening mammogram for malignant neoplasm of breast: Secondary | ICD-10-CM

## 2013-09-12 ENCOUNTER — Ambulatory Visit (INDEPENDENT_AMBULATORY_CARE_PROVIDER_SITE_OTHER): Payer: Medicare Other | Admitting: Internal Medicine

## 2013-10-09 ENCOUNTER — Ambulatory Visit (HOSPITAL_COMMUNITY)
Admission: RE | Admit: 2013-10-09 | Discharge: 2013-10-09 | Disposition: A | Discharge status: Home / Self Care | Payer: Medicare Other | Source: Ambulatory Visit | Attending: Family Medicine | Admitting: Family Medicine

## 2013-10-09 DIAGNOSIS — Z1231 Encounter for screening mammogram for malignant neoplasm of breast: Secondary | ICD-10-CM | POA: Insufficient documentation

## 2013-10-25 IMAGING — NM NM HEPATO W/GB/PHARM/[PERSON_NAME]
2 series · 12 of 12 positions shown · non-contrast
Comparison: None.

CLINICAL DATA: Abdominal pain

NUCLEAR MEDICINE HEPATOBILIARY IMAGING WITH GALLBLADDER EF
TECHNIQUE: Sequential images of the abdomen were obtained [DATE] minutes following intravenous administration of
radiopharmaceutical. After oral ingestion of 8 ounces of Half-and-
Half cream, gallbladder ejection fraction was determined.
Radiopharmaceutical:  5.0 mCi Bc-99m Choletec

[hida · 3.20mm/px · 6 of 60 frames shown (1 of 2)]
[frame 6/60]
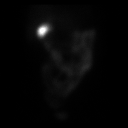
[frame 16/60]
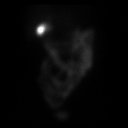
[frame 26/60]
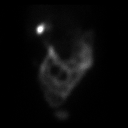
[frame 36/60]
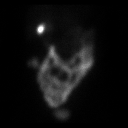
[frame 46/60]
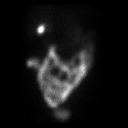
[frame 56/60]
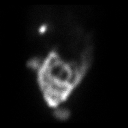

[hida · 3.20mm/px · 6 of 60 frames shown (2 of 2)]
[frame 6/60]
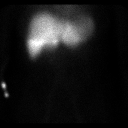
[frame 16/60]
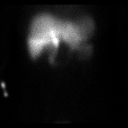
[frame 26/60]
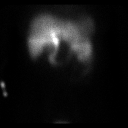
[frame 36/60]
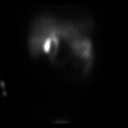
[frame 46/60]
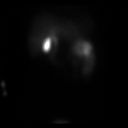
[frame 56/60]
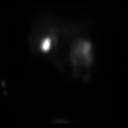

[12 of 12 positions shown; findings below may reference images not displayed]

FINDINGS: Gallbladder activity occurs after 30 minutes.  Small
bowel activity occurs after 20 minutes.  Gallbladder ejection
fraction is 85% after 1 hour.

The patient did experience mild abdominal pain symptoms after oral
ingestion of Half-and-Half cream.
IMPRESSION: Cystic and common bile ducts are patent.  Gallbladder ejection
fraction is within normal limits.

## 2013-11-28 ENCOUNTER — Ambulatory Visit (INDEPENDENT_AMBULATORY_CARE_PROVIDER_SITE_OTHER): Payer: Medicare Other | Admitting: Internal Medicine

## 2013-12-12 ENCOUNTER — Ambulatory Visit (INDEPENDENT_AMBULATORY_CARE_PROVIDER_SITE_OTHER): Payer: Medicare Other | Admitting: Internal Medicine

## 2013-12-12 ENCOUNTER — Encounter (INDEPENDENT_AMBULATORY_CARE_PROVIDER_SITE_OTHER): Payer: Self-pay | Admitting: *Deleted

## 2013-12-12 ENCOUNTER — Encounter (INDEPENDENT_AMBULATORY_CARE_PROVIDER_SITE_OTHER): Payer: Self-pay | Admitting: Internal Medicine

## 2013-12-12 VITALS — BP 108/70 | HR 72 | Temp 97.4°F | Ht 65.0 in | Wt 146.4 lb

## 2013-12-12 DIAGNOSIS — R740 Nonspecific elevation of levels of transaminase and lactic acid dehydrogenase [LDH]: Secondary | ICD-10-CM

## 2013-12-12 DIAGNOSIS — R748 Abnormal levels of other serum enzymes: Secondary | ICD-10-CM

## 2013-12-12 DIAGNOSIS — K824 Cholesterolosis of gallbladder: Secondary | ICD-10-CM

## 2013-12-12 LAB — COMPREHENSIVE METABOLIC PANEL
ALBUMIN: 4.6 g/dL (ref 3.5–5.2)
ALK PHOS: 150 U/L — AB (ref 39–117)
ALT: 64 U/L — ABNORMAL HIGH (ref 0–35)
AST: 37 U/L (ref 0–37)
BUN: 10 mg/dL (ref 6–23)
CO2: 29 mEq/L (ref 19–32)
CREATININE: 0.82 mg/dL (ref 0.50–1.10)
Calcium: 9.8 mg/dL (ref 8.4–10.5)
Chloride: 103 mEq/L (ref 96–112)
GLUCOSE: 102 mg/dL — AB (ref 70–99)
POTASSIUM: 5 meq/L (ref 3.5–5.3)
Sodium: 143 mEq/L (ref 135–145)
Total Bilirubin: 0.5 mg/dL (ref 0.2–1.2)
Total Protein: 7.3 g/dL (ref 6.0–8.3)

## 2013-12-12 NOTE — Progress Notes (Signed)
Subjective:    Patient ID: Mackenzie Black, female    DOB: [birth year], 58 y.o.   MRN: [id number]  HPI Here today for f/u of her elevated liver enzymes. Presently taking Humira in December of 2014. She tells me she is not exercising due to her RA. Her appetite is good. No weight loss.  She has ? Blocks in her feet for pain by Dr. Berline Lopes. Hx of elevated liver enzymes in the past. She takes a BC powder occasionally for headaches. She is disabled. She worked at [employer].      CMP     Component Value Date/Time   NA 143 12/11/2013 1015   K 5.0 12/11/2013 1015   CL 103 12/11/2013 1015   CO2 29 12/11/2013 1015   GLUCOSE 102* 12/11/2013 1015   BUN 10 12/11/2013 1015   CREATININE 0.82 12/11/2013 1015   CREATININE 0.73 09/24/2012 1430   CALCIUM 9.8 12/11/2013 1015   PROT 7.3 12/11/2013 1015   ALBUMIN 4.6 12/11/2013 1015   AST 37 12/11/2013 1015   ALT 64* 12/11/2013 1015   ALKPHOS 150* 12/11/2013 1015   BILITOT 0.5 12/11/2013 1015   GFRNONAA >90 09/24/2012 1430   GFRAA >90 09/24/2012 1430   02/27/2013 ALP 104, AST 20, ALT 18 (all normal).     Her liver enzymes were normal 11/2011  The medications below have been on hold.  Atorvastatin 20mg  on hold (09/19/1515-08/02/735)  Folic acid 1mg  on hold (12/21/2011-12/30/2011)  Diclofenac on hold (Jun 22, 2011-12/2011)  Methotrexate on hold (12/21/2011-12/30/2011)  06/07/12 ALP 194, AST 72, ALT 104. Total bili 0.5  03/07/2012 ALP 211, AST 60, ALT 163  02/04/2011 ALP 137, AST 32, ALT 37  01/25/2012 Hep A,B,C negative   07/04/2012 US Abdomen:  IMPRESSION:  Cholelithiasis without evidence of cholecystitis.    03/18/12 HIDA SCAN:  IMPRESSION:  Cystic and common bile ducts are patent. Gallbladder ejection  fraction is within normal limits.  03/09/2012 US abdomen: 1. The appearance of the liver is unremarkable.  2. Multiple small immobile echogenic but nonshadowing foci in the  gallbladder, as above. Differential considerations strongly favor    gallbladder polyps, but also include adherent nonshadowing  gallstones, or less likely neoplasm. Repeat right upper quadrant  abdominal ultrasound in 3 months is recommended to ensure this  stability or resolution of this finding.      Review of Systems Past Medical History  Diagnosis Date  . Neuropathy   . RA (rheumatoid arthritis)   . Back pain   . Hypertension   . Hypertension     Past Surgical History  Procedure Laterality Date  . Tonsillectomy    . Excision of cyst of hand    . Tubal ligation    . Knee surgery    . Bilateral foot surgery    . Colonoscopy  03/31/2011    Procedure: COLONOSCOPY;  Surgeon: Jamesetta So, MD;  Location: [facility];  Service: Gastroenterology;  Laterality: N/A;    Allergies  Allergen Reactions  . Humira [Adalimumab]     rash    Current Outpatient Prescriptions on File Prior to Visit  Medication Sig Dispense Refill  . Adalimumab (HUMIRA [place]) Inject into the skin. 40mg  every other week      . Aspirin-Salicylamide-Caffeine (BC HEADACHE POWDER PO) Take 1 Package by mouth daily as needed (RA).       . gabapentin (NEURONTIN) 300 MG capsule Take 300 mg by mouth 2 (two) times daily.  No current facility-administered medications on file prior to visit.        Objective:   Physical Exam  Filed Vitals:   12/12/13 1413  BP: 108/70  Pulse: 72  Temp: 97.4 F (36.3 C)  Height: 5\' 5"  (1.651 m)  Weight: 146 lb 6.4 oz (66.407 kg)   Alert and oriented. Skin warm and dry. Oral mucosa is moist.   . Sclera anicteric, conjunctivae is pink. Thyroid not enlarged. No cervical lymphadenopathy. Lungs clear. Heart regular rate and rhythm.  Abdomen is soft. Bowel sounds are positive. No hepatomegaly. No abdominal masses felt. No tenderness.  No edema to lower extremities.         Assessment & Plan:  Elevated transaminases. In January they were normal. ? Possible from the Humira now. [place] RUQ for possible hx of gallbladder polyps.  Hepatic  function in 6 weeks. OV in 6 months with DR. Rehman.

## 2013-12-12 NOTE — Patient Instructions (Addendum)
OV in 6 months with Dr. Laural Golden with a Hepatic function. Hepatic function in 6 weeks.

## 2013-12-14 ENCOUNTER — Telehealth (INDEPENDENT_AMBULATORY_CARE_PROVIDER_SITE_OTHER): Payer: Self-pay | Admitting: *Deleted

## 2013-12-14 DIAGNOSIS — R748 Abnormal levels of other serum enzymes: Secondary | ICD-10-CM

## 2013-12-14 NOTE — Telephone Encounter (Signed)
.  Per Lelon Perla patient to have labs drawn in 6 weeks.

## 2013-12-22 ENCOUNTER — Ambulatory Visit (HOSPITAL_COMMUNITY): Payer: Medicare Other

## 2013-12-29 ENCOUNTER — Ambulatory Visit (HOSPITAL_COMMUNITY)
Admission: RE | Admit: 2013-12-29 | Discharge: 2013-12-29 | Disposition: A | Discharge status: Home / Self Care | Payer: Medicare Other | Source: Ambulatory Visit | Attending: Internal Medicine | Admitting: Internal Medicine

## 2013-12-29 DIAGNOSIS — K828 Other specified diseases of gallbladder: Secondary | ICD-10-CM | POA: Insufficient documentation

## 2013-12-29 DIAGNOSIS — K8020 Calculus of gallbladder without cholecystitis without obstruction: Secondary | ICD-10-CM | POA: Diagnosis present

## 2013-12-29 DIAGNOSIS — K824 Cholesterolosis of gallbladder: Secondary | ICD-10-CM

## 2013-12-29 DIAGNOSIS — R7989 Other specified abnormal findings of blood chemistry: Secondary | ICD-10-CM | POA: Diagnosis present

## 2014-01-03 ENCOUNTER — Other Ambulatory Visit (INDEPENDENT_AMBULATORY_CARE_PROVIDER_SITE_OTHER): Payer: Self-pay | Admitting: *Deleted

## 2014-01-03 ENCOUNTER — Encounter (INDEPENDENT_AMBULATORY_CARE_PROVIDER_SITE_OTHER): Payer: Self-pay | Admitting: *Deleted

## 2014-01-03 DIAGNOSIS — R748 Abnormal levels of other serum enzymes: Secondary | ICD-10-CM

## 2014-01-25 LAB — HEPATIC FUNCTION PANEL
ALBUMIN: 4.3 g/dL (ref 3.5–5.2)
ALT: 14 U/L (ref 0–35)
AST: 18 U/L (ref 0–37)
Alkaline Phosphatase: 84 U/L (ref 39–117)
BILIRUBIN DIRECT: 0.1 mg/dL (ref 0.0–0.3)
Indirect Bilirubin: 0.4 mg/dL (ref 0.2–1.2)
Total Bilirubin: 0.5 mg/dL (ref 0.2–1.2)
Total Protein: 7.2 g/dL (ref 6.0–8.3)

## 2014-05-08 IMAGING — MG MM DIGITAL DIAGNOSTIC UNILAT R
8 series · 8 of 8 positions shown · non-contrast
Comparison: 09/30/2011, 09/15/2011, 09/12/2010, 08/26/2009,
08/24/2008

CLINICAL DATA: The patient returns for evaluation of
calcifications in the right breast noted on recent screening study
dated 09/19/2012.

DIGITAL DIAGNOSTIC RIGHT MAMMOGRAM

[R CC (1 of 3)]
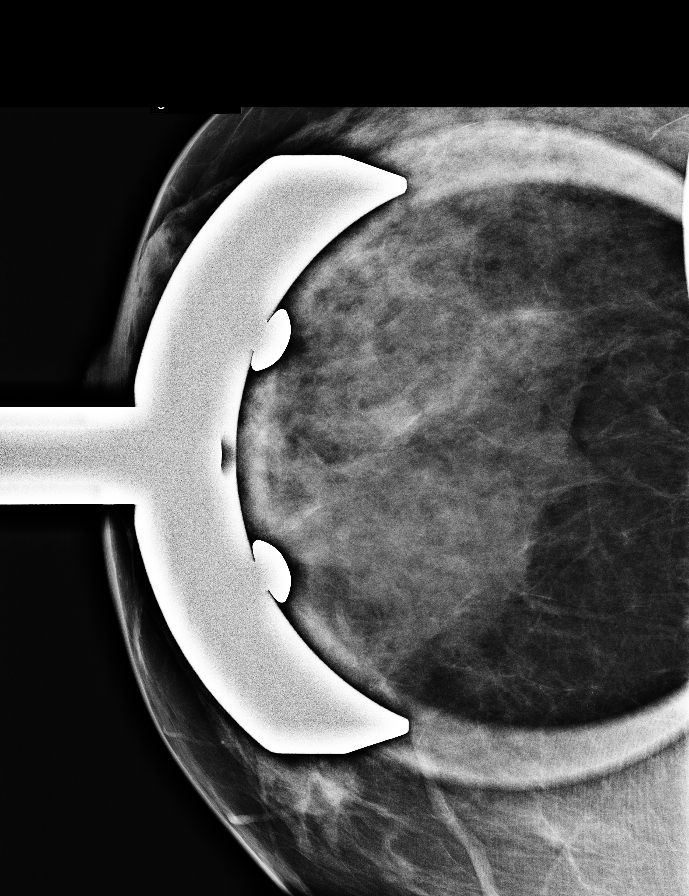

[R ML (1 of 5)]
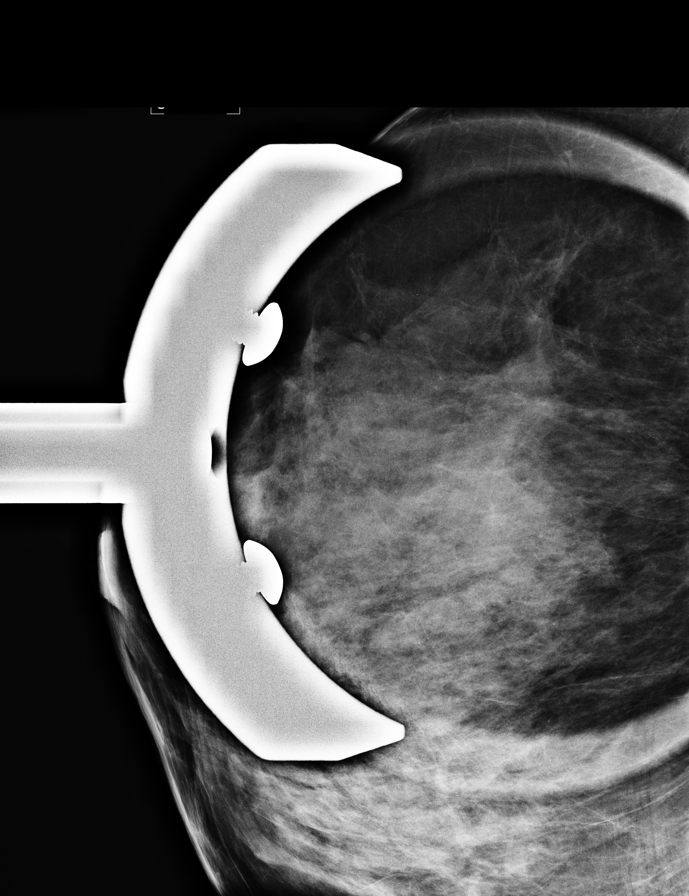

[R CC (2 of 3)]
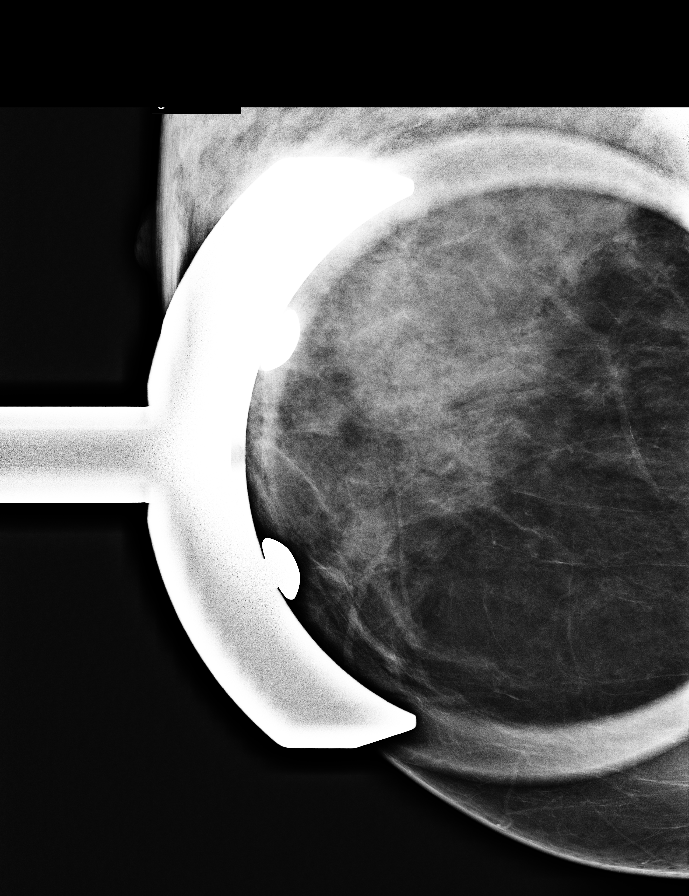

[R ML (2 of 5)]
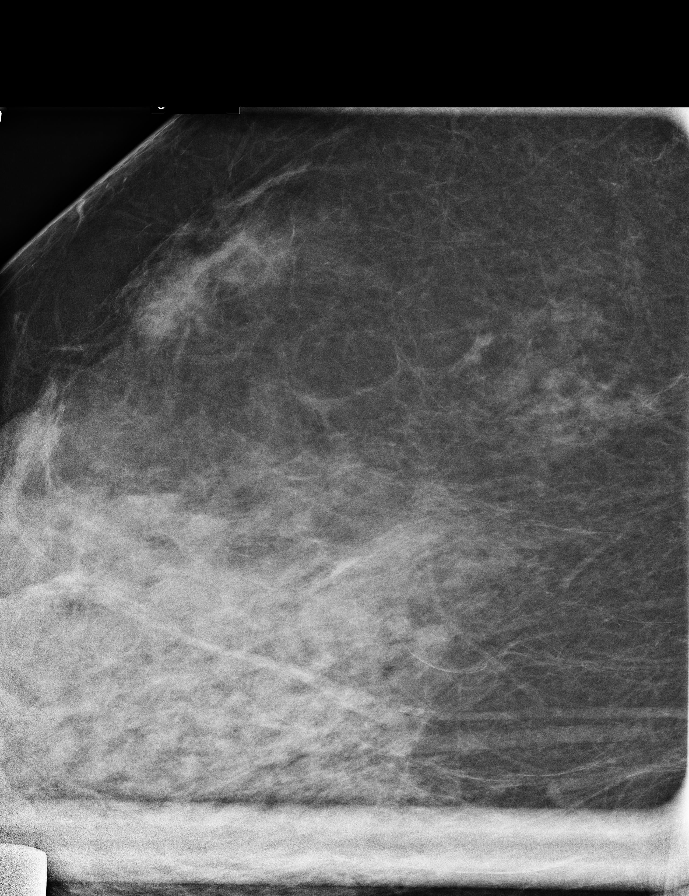

[R ML (3 of 5)]
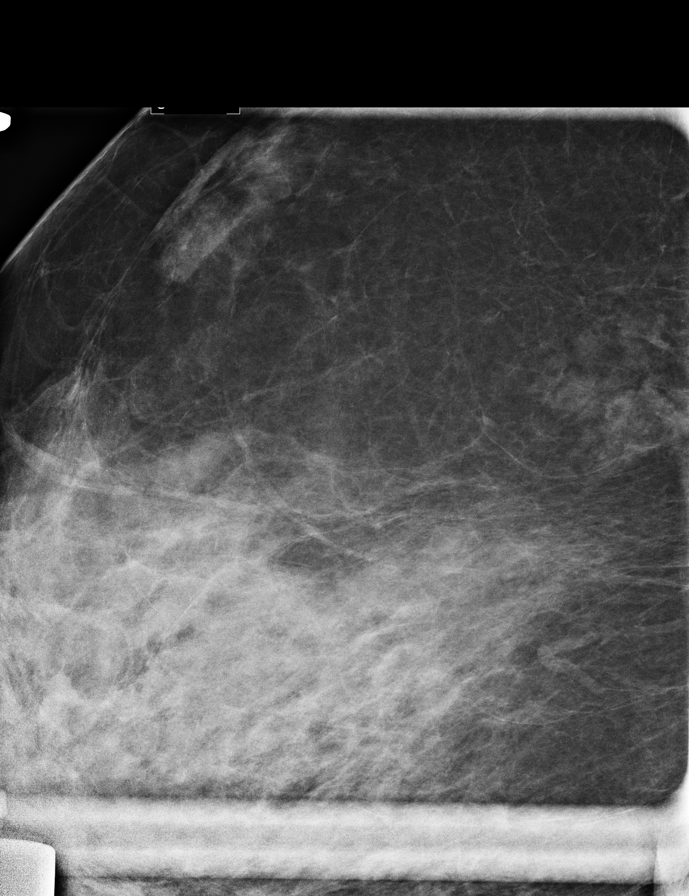

[R ML (4 of 5)]
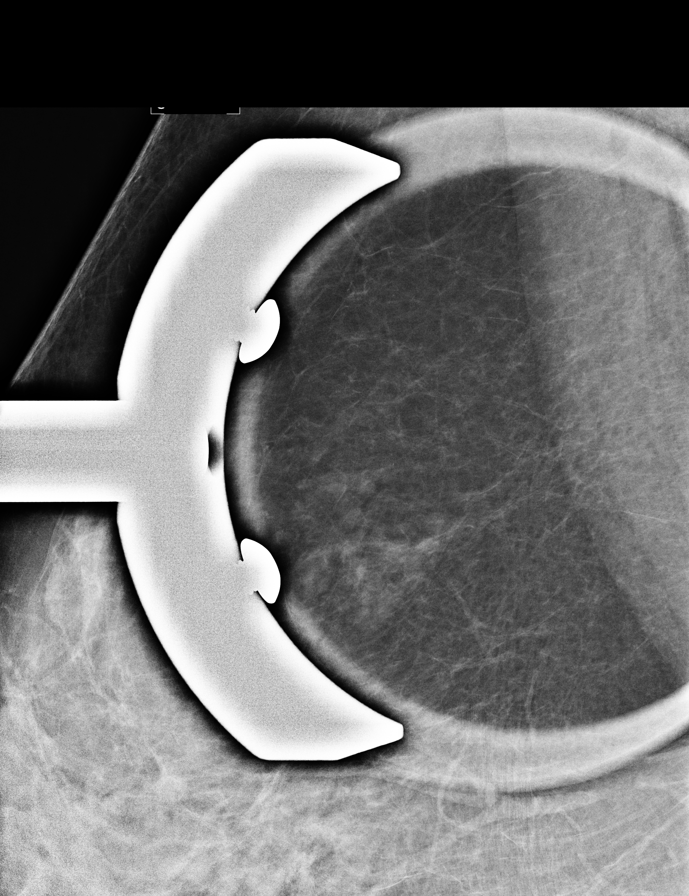

[R CC (3 of 3)]
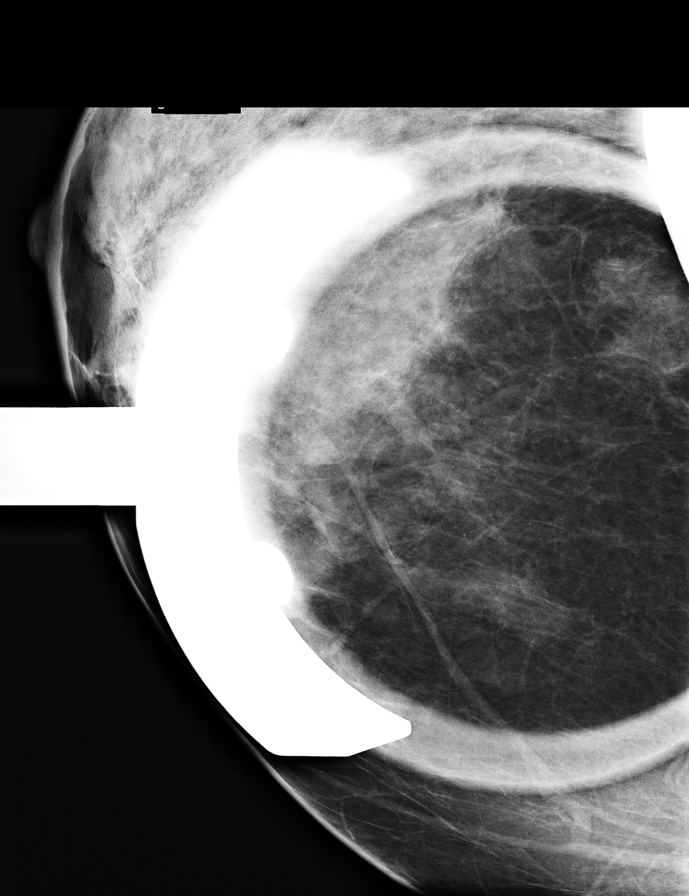

[R ML (5 of 5)]
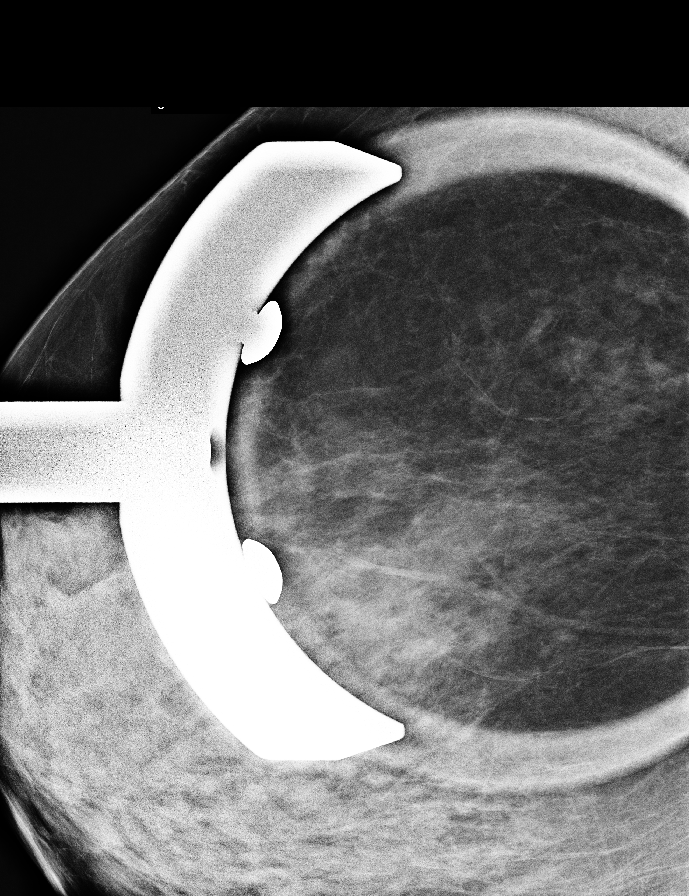

[8 of 8 positions shown; findings below may reference images not displayed]

FINDINGS: ACR Breast Density Category c:  The breast tissue is
heterogeneously dense, which may obscure small masses.

There are grouped punctate round microcalcifications in the right
upper inner quadrant posteriorly.  There are also scattered round
punctate microcalcifications in the 12 o'clock position of the
right breast.  In reviewing prior studies, these are felt to have
been present and unchanged dating back to 0050.  These are felt to
be benign in etiology.  No suspicious linear forms are identified.
IMPRESSION: Stable benign appearing calcifications in the right breast.

RECOMMENDATION:
Yearly screening mammography is suggested.

I have discussed the findings and recommendations with the patient.
Results were also provided in writing at the conclusion of the
visit.  If applicable, a reminder letter will be sent to the
patient regarding her next appointment.

BI-RADS CATEGORY 2:  Benign finding(s).

## 2014-06-13 ENCOUNTER — Ambulatory Visit (INDEPENDENT_AMBULATORY_CARE_PROVIDER_SITE_OTHER): Payer: Medicare Other | Admitting: Internal Medicine

## 2014-07-11 ENCOUNTER — Encounter (INDEPENDENT_AMBULATORY_CARE_PROVIDER_SITE_OTHER): Payer: Self-pay | Admitting: Internal Medicine

## 2014-07-11 ENCOUNTER — Ambulatory Visit (INDEPENDENT_AMBULATORY_CARE_PROVIDER_SITE_OTHER): Payer: Medicare Other | Admitting: Internal Medicine

## 2014-07-11 VITALS — BP 120/78 | HR 80 | Temp 97.0°F | Ht 67.0 in | Wt 147.0 lb

## 2014-07-11 DIAGNOSIS — R748 Abnormal levels of other serum enzymes: Secondary | ICD-10-CM | POA: Diagnosis not present

## 2014-07-11 NOTE — Progress Notes (Signed)
Subjective:    Patient ID: Mackenzie Black, female    DOB: 04/16/1955, 59 y.o.   MRN: [id number]  HPI Here today for f/u of her elevated liver enzymes. Markers for Hepatitis negative. Auto immune process ruled out.  She tells me the Humira has been on hold since she started the Bactrim for a UTI. Humira has been onhold. Last shot was 05/29/2014 She takes every two weeks.  Hepatic function in December before taking Humira were negatie.  Hx of RA and is followed by Dr. Lenna Gilford. Started Humira 02/02/2013. States she had a UTI in April and was covered with Bactrim by Dr. Laural Golden.  05/14/2014 total bili 0.4, AST 102, ALT 220, ALP 170 WBC 8.6, H and H 14.3 and 44.4, platelet ct 293.  Appetite has been good. She has gained 7 pounds since her last visit in January.  She denies any rashes. She does have some joint pain from her RA. She has a BM daily. No melena or BRRB.   Hx of colon cancer in a sister.  Brother had rectal cancer.  Hepatic Function Panel     Component Value Date/Time   PROT 7.2 01/25/2014 1005   ALBUMIN 4.3 01/25/2014 1005   AST 18 01/25/2014 1005   ALT 14 01/25/2014 1005   ALKPHOS 84 01/25/2014 1005   BILITOT 0.5 01/25/2014 1005   BILIDIR 0.1 01/25/2014 1005   IBILI 0.4 01/25/2014 1005       Her liver enzymes were normal 11/2011  The medications below have been on hold.  Atorvastatin 20mg      Folic acid 1mg    Diclofenac on hold   Methotrexate on hold   06/07/12 ALP 194, AST 72, ALT 104. Total bili 0.5  03/07/2012 ALP 211, AST 60, ALT 163  02/04/2011 ALP 137, AST 32, ALT 37  01/25/2012 Hep A,B,C negative   07/04/2012 US Abdomen:  IMPRESSION:  Cholelithiasis without evidence of cholecystitis.    03/18/12 HIDA SCAN:  IMPRESSION:  Cystic and common bile ducts are patent. Gallbladder ejection  fraction is within normal limits.  03/09/2012 US abdomen: 1. The appearance of the liver is unremarkable.  2. Multiple small immobile echogenic but nonshadowing  foci in the  gallbladder, as above. Differential considerations strongly favor  gallbladder polyps, but also include adherent nonshadowing  gallstones, or less likely neoplasm. Repeat right upper quadrant  abdominal ultrasound in 3 months is recommended to ensure this  stability or resolution of this finding.     Review of Systems Past Medical History  Diagnosis Date  . Neuropathy   . RA (rheumatoid arthritis)   . Back pain   . Hypertension   . Hypertension     Past Surgical History  Procedure Laterality Date  . Tonsillectomy    . Excision of cyst of hand    . Tubal ligation    . Knee surgery    . Bilateral foot surgery    . Colonoscopy  03/31/2011    Procedure: COLONOSCOPY;  Surgeon: Jamesetta So, MD;  Location: [facility];  Service: Gastroenterology;  Laterality: N/A;    Allergies  Allergen Reactions  . Humira [Adalimumab]     rash    Current Outpatient Prescriptions on File Prior to Visit  Medication Sig Dispense Refill  . Adalimumab (HUMIRA Sterling) Inject into the skin. 40mg  every other week    . Aspirin-Salicylamide-Caffeine (BC HEADACHE POWDER PO) Take 1 Package by mouth daily as needed (RA).     . gabapentin (NEURONTIN) 300  MG capsule Take 300 mg by mouth 2 (two) times daily.     . mupirocin ointment (BACTROBAN) 2 % Place 1 application into the nose 2 (two) times daily.     No current facility-administered medications on file prior to visit.        Objective:   Physical ExamBlood pressure 120/78, pulse 80, temperature 97 F (36.1 C), height 5\' 7"  (1.702 m), weight 147 lb (66.679 kg).  Alert and oriented. Skin warm and dry. Oral mucosa is moist.   . Sclera anicteric, conjunctivae is pink. Thyroid not enlarged. No cervical lymphadenopathy. Lungs clear. Heart regular rate and rhythm.  Abdomen is soft. Bowel sounds are positive. No hepatomegaly. No abdominal masses felt. No tenderness.  No edema to lower extremities.         Assessment & Plan:    Elevated liver enzymes ? From the Humira. Am going to repeat liver enzymes Friday fasting.  OV in 4 months with Dr. Laural Golden.

## 2014-07-11 NOTE — Patient Instructions (Signed)
OV 4 months with Dr. Laural Golden

## 2014-07-12 ENCOUNTER — Encounter (INDEPENDENT_AMBULATORY_CARE_PROVIDER_SITE_OTHER): Payer: Self-pay

## 2014-07-12 ENCOUNTER — Encounter (INDEPENDENT_AMBULATORY_CARE_PROVIDER_SITE_OTHER): Payer: Self-pay | Admitting: *Deleted

## 2014-07-13 LAB — HEPATIC FUNCTION PANEL
ALK PHOS: 94 U/L (ref 39–117)
ALT: 30 U/L (ref 0–35)
AST: 26 U/L (ref 0–37)
Albumin: 4.1 g/dL (ref 3.5–5.2)
BILIRUBIN TOTAL: 0.4 mg/dL (ref 0.2–1.2)
Bilirubin, Direct: 0.1 mg/dL (ref 0.0–0.3)
Indirect Bilirubin: 0.3 mg/dL (ref 0.2–1.2)
Total Protein: 7.1 g/dL (ref 6.0–8.3)

## 2014-09-12 ENCOUNTER — Other Ambulatory Visit (HOSPITAL_COMMUNITY): Payer: Self-pay | Admitting: Family Medicine

## 2014-09-12 DIAGNOSIS — Z1231 Encounter for screening mammogram for malignant neoplasm of breast: Secondary | ICD-10-CM

## 2014-10-12 ENCOUNTER — Ambulatory Visit (HOSPITAL_COMMUNITY)
Admission: RE | Admit: 2014-10-12 | Discharge: 2014-10-12 | Disposition: A | Discharge status: Home / Self Care | Payer: Medicare Other | Source: Ambulatory Visit | Attending: Family Medicine | Admitting: Family Medicine

## 2014-10-12 DIAGNOSIS — Z1231 Encounter for screening mammogram for malignant neoplasm of breast: Secondary | ICD-10-CM | POA: Insufficient documentation

## 2014-10-14 ENCOUNTER — Emergency Department (HOSPITAL_COMMUNITY): Payer: Medicare Other

## 2014-10-14 ENCOUNTER — Encounter (HOSPITAL_COMMUNITY): Payer: Self-pay | Admitting: Emergency Medicine

## 2014-10-14 ENCOUNTER — Emergency Department (HOSPITAL_COMMUNITY)
Admission: EM | Admit: 2014-10-14 | Discharge: 2014-10-14 | Disposition: A | Discharge status: Home / Self Care | Payer: Medicare Other | Attending: Physician Assistant | Admitting: Physician Assistant

## 2014-10-14 DIAGNOSIS — I10 Essential (primary) hypertension: Secondary | ICD-10-CM | POA: Insufficient documentation

## 2014-10-14 DIAGNOSIS — W1839XA Other fall on same level, initial encounter: Secondary | ICD-10-CM | POA: Insufficient documentation

## 2014-10-14 DIAGNOSIS — S6991XA Unspecified injury of right wrist, hand and finger(s), initial encounter: Secondary | ICD-10-CM | POA: Diagnosis present

## 2014-10-14 DIAGNOSIS — G629 Polyneuropathy, unspecified: Secondary | ICD-10-CM | POA: Insufficient documentation

## 2014-10-14 DIAGNOSIS — Z8739 Personal history of other diseases of the musculoskeletal system and connective tissue: Secondary | ICD-10-CM | POA: Diagnosis not present

## 2014-10-14 DIAGNOSIS — Y92096 Garden or yard of other non-institutional residence as the place of occurrence of the external cause: Secondary | ICD-10-CM | POA: Diagnosis not present

## 2014-10-14 DIAGNOSIS — Y9389 Activity, other specified: Secondary | ICD-10-CM | POA: Insufficient documentation

## 2014-10-14 DIAGNOSIS — Z79899 Other long term (current) drug therapy: Secondary | ICD-10-CM | POA: Insufficient documentation

## 2014-10-14 DIAGNOSIS — S63501A Unspecified sprain of right wrist, initial encounter: Secondary | ICD-10-CM | POA: Insufficient documentation

## 2014-10-14 DIAGNOSIS — S59911A Unspecified injury of right forearm, initial encounter: Secondary | ICD-10-CM | POA: Insufficient documentation

## 2014-10-14 DIAGNOSIS — Y998 Other external cause status: Secondary | ICD-10-CM | POA: Insufficient documentation

## 2014-10-14 MED ORDER — NAPROXEN 500 MG PO TABS: 500.0000 mg | ORAL_TABLET | Freq: Two times a day (BID) | ORAL | Status: DC

## 2014-10-14 MED ORDER — TRAMADOL HCL 50 MG PO TABS: 50.0000 mg | ORAL_TABLET | Freq: Four times a day (QID) | ORAL | Status: DC | PRN

## 2014-10-14 MED ORDER — IBUPROFEN 800 MG PO TABS
800.0000 mg | ORAL_TABLET | Freq: Once | ORAL | Status: AC
  Administered 2014-10-14: 800 mg via ORAL
  Filled 2014-10-14: qty 1

## 2014-10-14 MED ORDER — HYDROCODONE-ACETAMINOPHEN 5-325 MG PO TABS
1.0000 | ORAL_TABLET | Freq: Once | ORAL | Status: AC
Start: 2014-10-14 — End: 2014-10-14
  Administered 2014-10-14: 1 via ORAL
  Filled 2014-10-14: qty 1

## 2014-10-14 NOTE — ED Notes (Signed)
Pt reports fell and landed on right hand this am. Pt reports right hand/forearm pain ever since.no deformity noted.

## 2014-10-14 NOTE — Discharge Instructions (Signed)
Ligament Sprain A ligament sprain is when the bands of tissue that hold bones together (ligament) are stretched. HOME CARE   Rest the injured area.  Start using the joint when told to by your doctor.  Keep the injured area raised (elevated) above the level of the heart. This may lessen puffiness (swelling).  Put ice on the injured area.  Put ice in a plastic bag.  Place a towel between your skin and the bag.  Leave the ice on for 15-20 minutes, 03-04 times a day.  Wear a splint, cast, or an elastic bandage as told by your doctor.  Only take medicine as told by your doctor.  Use crutches as told by your doctor. Do not put weight on the injured joint until told to by your doctor. GET HELP RIGHT AWAY IF:   You have more bruising, puffiness, or pain.  The leg was injured and the toes are cold, tingling, numb, or blue.  The arm was injured and the fingers are cold, tingling, numb, or blue.  The pain is not helped with medicine.  The pain gets worse. MAKE SURE YOU:   Understand these instructions.  Will watch this condition.  Will get help right away if you are not doing well or get worse. Document Released: 07/22/2007 Document Revised: 11/23/2012 Document Reviewed: 07/22/2007 [facility] Patient Information 2015 [place], [place]. This information is not intended to replace advice given to you by your health care provider. Make sure you discuss any questions you have with your health care provider.

## 2014-10-14 NOTE — ED Provider Notes (Signed)
CSN: [id number]     Arrival date & time 10/14/14  1630 History  This chart was scribed for non-physician practitioner, Kem Parkinson, PA-C, working with Macarthur Critchley, MD, by Stephania Fragmin, ED Scribe. This patient was seen in room APFT20/APFT20 and the patient's care was started at 4:58 PM.   Chief Complaint  Patient presents with  . Hand Injury   Patient is a 59 y.o. female presenting with hand injury. The history is provided by the patient. No language interpreter was used.  Hand Injury Location:  Arm Time since incident:  7 hours Injury: yes   Mechanism of injury: fall   Fall:    Fall occurred:  [employer] of impact:  Hands and buttocks Arm location:  R forearm Pain details:    Radiates to:  Does not radiate   Severity:  Moderate   Onset quality:  Sudden   Duration:  7 hours   Timing:  Constant   Progression:  Worsening Chronicity:  New Relieved by:  None tried Worsened by:  Nothing tried Ineffective treatments:  None tried Associated symptoms: no fever     HPI Comments: Mackenzie Black is a 59 y.o. female with a history of neuropathy and RA, who presents to the Emergency Department complaining of constant, moderate right forearm and wrist pain S/P a fall that occurred this morning. Patient had bent to pick something up in her yard outside when she lost her balance and fell backwards, catching herself with her bilateral hands, although more predominantly on the right side.  C/o pain and swelling to her wrist that is worse with movement.  She denies other injuries, numbness or weakness.    Past Medical History  Diagnosis Date  . Neuropathy   . RA (rheumatoid arthritis)   . Back pain   . Hypertension   . Hypertension    Past Surgical History  Procedure Laterality Date  . Tonsillectomy    . Excision of cyst of hand    . Tubal ligation    . Knee surgery    . Bilateral foot surgery    . Colonoscopy  03/31/2011    Procedure: COLONOSCOPY;  Surgeon: Jamesetta So, MD;  Location: [facility];  Service: Gastroenterology;  Laterality: N/A;   Family History  Problem Relation Age of Onset  . Colon cancer Sister   . Colon cancer Brother   . Stroke Mother   . Cancer - Lung Father    Social History  Substance Use Topics  . Smoking status: Never Smoker   . Smokeless tobacco: None  . Alcohol Use: No   OB History    No data available     Review of Systems  Constitutional: Negative for fever and chills.  Genitourinary: Negative for dysuria and difficulty urinating.  Musculoskeletal: Positive for myalgias (right forearm pain), joint swelling and arthralgias (right wrist pain).  Skin: Negative for color change and wound.  All other systems reviewed and are negative.  Allergies  Humira  Home Medications   Prior to Admission medications   Medication Sig Start Date End Date Taking? Authorizing Provider  Adalimumab (HUMIRA [place]) Inject into the skin. 40mg  every other week    Historical Provider, MD  Aspirin-Salicylamide-Caffeine (BC HEADACHE POWDER PO) Take 1 Package by mouth daily as needed (RA).     Historical Provider, MD  gabapentin (NEURONTIN) 300 MG capsule Take 300 mg by mouth 2 (two) times daily.     Historical Provider, MD  mupirocin  ointment (BACTROBAN) 2 % Place 1 application into the nose 2 (two) times daily.    Historical Provider, MD   BP 138/71 mmHg  Pulse 72  Temp(Src) 98.3 F (36.8 C) (Oral)  Resp 18  Ht 5\' 7"  (1.702 m)  Wt 145 lb (65.772 kg)  BMI 22.71 kg/m2  SpO2 99% Physical Exam  Constitutional: She is oriented to person, place, and time. She appears well-developed and well-nourished. No distress.  HENT:  Head: Normocephalic and atraumatic.  Eyes: Conjunctivae and EOM are normal.  Neck: Neck supple. No tracheal deviation present.  Cardiovascular: Normal rate, regular rhythm and normal heart sounds.   Pulmonary/Chest: Effort normal. No respiratory distress.  Musculoskeletal: Normal range of motion. She exhibits  tenderness.  Tenderness to palpation of the medial and lateral right wrist. Mild-to-moderate edema. Sensation and pulses intact.   Neurological: She is alert and oriented to person, place, and time.  Skin: Skin is warm and dry.  Psychiatric: She has a normal mood and affect. Her behavior is normal.  Nursing note and vitals reviewed.   ED Course  Procedures (including critical care time)  DIAGNOSTIC STUDIES: Oxygen Saturation is 99% on RA, normal by my interpretation.    COORDINATION OF CARE: 5:00 PM - Suspect ligamentous injury. Discussed treatment plan with pt at bedside which includes right arm XR to r/o bone involvement. Will also give pain-relieving medication and ice pack. Pt verbalized understanding and agreed to plan.   Imaging Review Dg Wrist Complete Right  10/14/2014   CLINICAL DATA:  Fall onto outstretched right hand. Right wrist and metacarpal pain.  EXAM: RIGHT WRIST - COMPLETE 3+ VIEW  COMPARISON:  06/03/2011  FINDINGS: No acute bony abnormality. Specifically, no fracture, subluxation, or dislocation. Soft tissues are intact.  IMPRESSION: No acute bony abnormality.   Electronically Signed   By: Rolm Baptise M.D.   On: 10/14/2014 17:53   Dg Hand Complete Right  10/14/2014   CLINICAL DATA:  Hand and wrist pain after falling today. Initial encounter.  EXAM: RIGHT HAND - COMPLETE 3+ VIEW  COMPARISON:  06/03/2011.  FINDINGS: The mineralization and alignment are normal. There is no evidence of acute fracture or dislocation. There are mild degenerative changes at the interphalangeal, first metacarpal phalangeal and radiocarpal joints. No focal soft tissue swelling identified.  IMPRESSION: No acute osseous findings.   Electronically Signed   By: Richardean Sale M.D.   On: 10/14/2014 17:56   I have personally reviewed and evaluated these images and lab results as part of my medical decision-making.  MDM   Final diagnoses:  Sprain of wrist, right, initial encounter    XR's neg for  fx.  Wrist splint applied.  Remains NV intact.  Pain improved.  Pt agrees to close ortho f/u.  Likely ligamentous injury discussed.    I personally performed the services described in this documentation, which was scribed in my presence. The recorded information has been reviewed and is accurate.     Kem Parkinson, PA-C 10/16/14 [place], MD 10/18/14 1457

## 2014-11-13 ENCOUNTER — Ambulatory Visit (INDEPENDENT_AMBULATORY_CARE_PROVIDER_SITE_OTHER): Payer: Medicare Other | Admitting: Internal Medicine

## 2014-12-10 ENCOUNTER — Ambulatory Visit (INDEPENDENT_AMBULATORY_CARE_PROVIDER_SITE_OTHER): Payer: Medicare Other | Admitting: Internal Medicine

## 2014-12-10 ENCOUNTER — Encounter (INDEPENDENT_AMBULATORY_CARE_PROVIDER_SITE_OTHER): Payer: Self-pay | Admitting: *Deleted

## 2014-12-10 ENCOUNTER — Other Ambulatory Visit (INDEPENDENT_AMBULATORY_CARE_PROVIDER_SITE_OTHER): Payer: Self-pay | Admitting: *Deleted

## 2014-12-10 ENCOUNTER — Encounter (INDEPENDENT_AMBULATORY_CARE_PROVIDER_SITE_OTHER): Payer: Self-pay | Admitting: Internal Medicine

## 2014-12-10 VITALS — BP 130/90 | HR 78 | Temp 98.2°F | Resp 18 | Ht 67.0 in | Wt 148.4 lb

## 2014-12-10 DIAGNOSIS — K219 Gastro-esophageal reflux disease without esophagitis: Secondary | ICD-10-CM

## 2014-12-10 DIAGNOSIS — R1030 Lower abdominal pain, unspecified: Secondary | ICD-10-CM | POA: Diagnosis not present

## 2014-12-10 DIAGNOSIS — R1310 Dysphagia, unspecified: Secondary | ICD-10-CM

## 2014-12-10 DIAGNOSIS — K8020 Calculus of gallbladder without cholecystitis without obstruction: Secondary | ICD-10-CM | POA: Diagnosis not present

## 2014-12-10 MED ORDER — DICYCLOMINE HCL 10 MG PO CAPS: 10.0000 mg | ORAL_CAPSULE | Freq: Three times a day (TID) | ORAL | Status: DC | PRN

## 2014-12-10 MED ORDER — ESOMEPRAZOLE MAGNESIUM 40 MG PO PACK: 40.0000 mg | PACK | Freq: Every day | ORAL | Status: DC

## 2014-12-10 NOTE — Progress Notes (Signed)
Presenting complaint;  Frequent heartburn difficulty swallowing and lower abdominal pain.  Subjective:  Patient is 59 year old Caucasian female who is here for scheduled visit. She was seen last year for elevated transaminases felt to be secondary to one a for medications but these have normalized. Workup for other etiologies was negative she was last seen in May 2016 and was doing well. She states she just had her blood work recently and LFTs were normal. She now presents with frequent heartburn. She states she's had it off and on for few years but lately it's occurring every day. At times she feels pressure in her chest and she also has regurgitation and coughing but she denies shortness of breath. She also complains of difficulty swallowing solids. She points to mid sternal areas site of bolus obstruction. 89 she's had few episodes of food impaction early with regurgitation. She states she's had swallowing difficulty for about a year but it was sporadic until few weeks ago. She also complains of intermittent pain across her lower abdomen. This pain is not associated with need to have a bowel movement hematuria or dysuria. Every now and then she feels constipated and may see blood on the tissue. She denies melena anorexia or weight loss. Her weight has been stable since her last visit 6 months ago. He had screening [facility] was a normal exam. She admits to taking BC powder couple of times a week.   Current Medications: Outpatient Encounter Prescriptions as of 12/10/2014  Medication Sig  . Adalimumab (HUMIRA Bruce) Inject into the skin. 40mg  every other week  . Aspirin-Salicylamide-Caffeine (BC HEADACHE POWDER PO) Take 1 Package by mouth daily as needed (RA).   . fluticasone (FLONASE) 50 MCG/ACT nasal spray Place 2 sprays into both nostrils daily.  Marland Kitchen gabapentin (NEURONTIN) 300 MG capsule Take 300 mg by mouth 2 (two) times daily.   . [DISCONTINUED] mupirocin  ointment (BACTROBAN) 2 % Place 1 application into the nose 2 (two) times daily.  . [DISCONTINUED] naproxen (NAPROSYN) 500 MG tablet Take 1 tablet (500 mg total) by mouth 2 (two) times daily with a meal. (Patient not taking: Reported on 12/10/2014)  . [DISCONTINUED] traMADol (ULTRAM) 50 MG tablet Take 1 tablet (50 mg total) by mouth every 6 (six) hours as needed. (Patient not taking: Reported on 12/10/2014)   No facility-administered encounter medications on file as of 12/10/2014.   Past Medical History  Diagnosis Date  . Neuropathy ([place])   . RA (rheumatoid arthritis) ([place]) diagnosed in 2013.    . Back pain   . Hypertension   . Hypertension    Past Surgical History  Procedure Laterality Date  . Tonsillectomy    . Excision of cyst of hand    . Tubal ligation    . Knee surgery    . Bilateral foot surgery    . Colonoscopy  03/31/2011    Procedure: COLONOSCOPY;  Surgeon: Jamesetta So, MD;  Location: [facility];  Service: Gastroenterology;  Laterality: N/A;    Allergies  Allergen Reactions  . Humira [Adalimumab]     rash     Objective: Blood pressure 130/90, pulse 78, temperature 98.2 F (36.8 C), temperature source Oral, resp. rate 18, height 5\' 7"  (1.702 m), weight 148 lb 6.4 oz (67.314 kg). Patient is alert and in no acute distress. Conjunctiva is pink. Sclera is nonicteric Oropharyngeal mucosa is normal. No neck masses or thyromegaly noted. Cardiac exam with regular rhythm normal S1 and S2. No  murmur or gallop noted. Lungs are clear to auscultation. Abdomen is symmetrical. Bowel sounds are normal. No bruits noted. On palpation abdomen is soft with mild tenderness in hypogastric region without guarding or rebound. No organomegaly or masses. No LE edema or clubbing noted.   Assessment:  #1. GERD. She is having daily heartburn and would benefit from anti-reflux measures and PPI. #2. Solid food dysphagia. Given that she has symptoms of GERD she could have esophageal  stricture or ring. She could also have esophageal motility disorder. #3. lower abdominal pain. Pattern is not typical of IBS. Could be related to frequent use of BC powder. #4. Asymptomatic cholelithiasis. He does not have any symptoms to suggest biliary tract disease.  Plan:  Anti-reflux measures. Esomeprazole 40 mg by mouth every morning. Dicyclomine 10 mg by mouth 3 times a day when necessary. Esophagogastroduodenoscopy with esophageal dilation to be performed in near future. Patient will be reevaluated prior to EGD to determine if she needs further workup for her lower abdominal pain. Will request copy of recent bloodwork for review.

## 2014-12-10 NOTE — Patient Instructions (Signed)
Esophagogastroduodenoscopy with esophageal dilation to be scheduled. 

## 2015-02-03 ENCOUNTER — Encounter (HOSPITAL_COMMUNITY): Payer: Self-pay | Admitting: Emergency Medicine

## 2015-02-03 ENCOUNTER — Emergency Department (HOSPITAL_COMMUNITY): Payer: Medicare Other

## 2015-02-03 ENCOUNTER — Emergency Department (HOSPITAL_COMMUNITY)
Admission: EM | Admit: 2015-02-03 | Discharge: 2015-02-03 | Disposition: A | Discharge status: Home / Self Care | Payer: Medicare Other | Attending: Emergency Medicine | Admitting: Emergency Medicine

## 2015-02-03 DIAGNOSIS — Z8669 Personal history of other diseases of the nervous system and sense organs: Secondary | ICD-10-CM | POA: Insufficient documentation

## 2015-02-03 DIAGNOSIS — R197 Diarrhea, unspecified: Secondary | ICD-10-CM | POA: Insufficient documentation

## 2015-02-03 DIAGNOSIS — Z7951 Long term (current) use of inhaled steroids: Secondary | ICD-10-CM | POA: Diagnosis not present

## 2015-02-03 DIAGNOSIS — R630 Anorexia: Secondary | ICD-10-CM | POA: Insufficient documentation

## 2015-02-03 DIAGNOSIS — R109 Unspecified abdominal pain: Secondary | ICD-10-CM | POA: Insufficient documentation

## 2015-02-03 DIAGNOSIS — Z79899 Other long term (current) drug therapy: Secondary | ICD-10-CM | POA: Insufficient documentation

## 2015-02-03 DIAGNOSIS — M069 Rheumatoid arthritis, unspecified: Secondary | ICD-10-CM | POA: Insufficient documentation

## 2015-02-03 DIAGNOSIS — I10 Essential (primary) hypertension: Secondary | ICD-10-CM | POA: Diagnosis not present

## 2015-02-03 LAB — URINALYSIS, ROUTINE W REFLEX MICROSCOPIC
Glucose, UA: NEGATIVE mg/dL
HGB URINE DIPSTICK: NEGATIVE
Nitrite: NEGATIVE
pH: 6 (ref 5.0–8.0)

## 2015-02-03 LAB — CBC WITH DIFFERENTIAL/PLATELET
BASOS ABS: 0 10*3/uL (ref 0.0–0.1)
BASOS PCT: 0 %
Eosinophils Absolute: 0.2 10*3/uL (ref 0.0–0.7)
Eosinophils Relative: 2 %
HEMATOCRIT: 45.6 % (ref 36.0–46.0)
Hemoglobin: 15.6 g/dL — ABNORMAL HIGH (ref 12.0–15.0)
Lymphocytes Relative: 24 %
Lymphs Abs: 1.8 10*3/uL (ref 0.7–4.0)
MCH: 30.8 pg (ref 26.0–34.0)
MCHC: 34.2 g/dL (ref 30.0–36.0)
MCV: 89.9 fL (ref 78.0–100.0)
MONO ABS: 0.9 10*3/uL (ref 0.1–1.0)
Monocytes Relative: 13 %
NEUTROS ABS: 4.4 10*3/uL (ref 1.7–7.7)
Neutrophils Relative %: 61 %
Platelets: 257 10*3/uL (ref 150–400)
RBC: 5.07 MIL/uL (ref 3.87–5.11)
RDW: 12.3 % (ref 11.5–15.5)
WBC: 7.3 10*3/uL (ref 4.0–10.5)

## 2015-02-03 LAB — COMPREHENSIVE METABOLIC PANEL
ALBUMIN: 4.2 g/dL (ref 3.5–5.0)
ALT: 25 U/L (ref 14–54)
AST: 26 U/L (ref 15–41)
Alkaline Phosphatase: 66 U/L (ref 38–126)
Anion gap: 8 (ref 5–15)
BILIRUBIN TOTAL: 0.3 mg/dL (ref 0.3–1.2)
BUN: 12 mg/dL (ref 6–20)
CO2: 25 mmol/L (ref 22–32)
CREATININE: 0.81 mg/dL (ref 0.44–1.00)
Calcium: 9.2 mg/dL (ref 8.9–10.3)
Chloride: 107 mmol/L (ref 101–111)
GFR calc Af Amer: >60 mL/min (ref >60)
GFR calc non Af Amer: >60 mL/min (ref >60)
GLUCOSE: 97 mg/dL (ref 65–99)
POTASSIUM: 3.3 mmol/L — AB (ref 3.5–5.1)
Sodium: 140 mmol/L (ref 135–145)
TOTAL PROTEIN: 7.7 g/dL (ref 6.5–8.1)

## 2015-02-03 LAB — URINE MICROSCOPIC-ADD ON: RBC / HPF: NONE SEEN RBC/hpf (ref 0–5)

## 2015-02-03 LAB — POC OCCULT BLOOD, ED: Fecal Occult Bld: NEGATIVE

## 2015-02-03 MED ORDER — LOPERAMIDE HCL 2 MG PO CAPS
2.0000 mg | ORAL_CAPSULE | Freq: Once | ORAL | Status: AC
  Administered 2015-02-03: 2 mg via ORAL
  Filled 2015-02-03: qty 1

## 2015-02-03 MED ORDER — POTASSIUM CHLORIDE CRYS ER 20 MEQ PO TBCR
40.0000 meq | EXTENDED_RELEASE_TABLET | Freq: Once | ORAL | Status: AC
  Administered 2015-02-03: 40 meq via ORAL
  Filled 2015-02-03: qty 2

## 2015-02-03 MED ORDER — IOHEXOL 300 MG/ML  SOLN
100.0000 mL | Freq: Once | INTRAMUSCULAR | Status: AC | PRN
  Administered 2015-02-03: 100 mL via INTRAVENOUS

## 2015-02-03 NOTE — Discharge Instructions (Signed)
Diarrhea Call Dr. Laural Golden in the morning.  Use immodium as needed for diarrhea. Return to the ED if you devleop new or worsening symptoms. Diarrhea is frequent loose and watery bowel movements. It can cause you to feel weak and dehydrated. Dehydration can cause you to become tired and thirsty, have a dry mouth, and have decreased urination that often is dark yellow. Diarrhea is a sign of another problem, most often an infection that will not last long. In most cases, diarrhea typically lasts 2-3 days. However, it can last longer if it is a sign of something more serious. It is important to treat your diarrhea as directed by your caregiver to lessen or prevent future episodes of diarrhea. CAUSES  Some common causes include:  Gastrointestinal infections caused by viruses, bacteria, or parasites.  Food poisoning or food allergies.  Certain medicines, such as antibiotics, chemotherapy, and laxatives.  Artificial sweeteners and fructose.  Digestive disorders. HOME CARE INSTRUCTIONS  Ensure adequate fluid intake (hydration): Have 1 cup (8 oz) of fluid for each diarrhea episode. Avoid fluids that contain simple sugars or sports drinks, fruit juices, whole milk products, and sodas. Your urine should be clear or pale yellow if you are drinking enough fluids. Hydrate with an oral rehydration solution that you can purchase at pharmacies, retail stores, and online. You can prepare an oral rehydration solution at home by mixing the following ingredients together:   - tsp table salt.   tsp baking soda.   tsp salt substitute containing potassium chloride.  1  tablespoons sugar.  1 L (34 oz) of water.  Certain foods and beverages may increase the speed at which food moves through the gastrointestinal (GI) tract. These foods and beverages should be avoided and include:  Caffeinated and alcoholic beverages.  High-fiber foods, such as raw fruits and vegetables, nuts, seeds, and whole grain breads and  cereals.  Foods and beverages sweetened with sugar alcohols, such as xylitol, sorbitol, and mannitol.  Some foods may be well tolerated and may help thicken stool including:  Starchy foods, such as rice, toast, pasta, low-sugar cereal, oatmeal, grits, baked potatoes, crackers, and bagels.  Bananas.  Applesauce.  Add probiotic-rich foods to help increase healthy bacteria in the GI tract, such as yogurt and fermented milk products.  Wash your hands well after each diarrhea episode.  Only take over-the-counter or prescription medicines as directed by your caregiver.  Take a warm bath to relieve any burning or pain from frequent diarrhea episodes. SEEK IMMEDIATE MEDICAL CARE IF:   You are unable to keep fluids down.  You have persistent vomiting.  You have blood in your stool, or your stools are black and tarry.  You do not urinate in 6-8 hours, or there is only a small amount of very dark urine.  You have abdominal pain that increases or localizes.  You have weakness, dizziness, confusion, or light-headedness.  You have a severe headache.  Your diarrhea gets worse or does not get better.  You have a fever or persistent symptoms for more than 2-3 days.  You have a fever and your symptoms suddenly get worse. MAKE SURE YOU:   Understand these instructions.  Will watch your condition.  Will get help right away if you are not doing well or get worse.   This information is not intended to replace advice given to you by your health care provider. Make sure you discuss any questions you have with your health care provider.   Document Released: 01/23/2002 Document  Revised: 02/23/2014 Document Reviewed: 10/11/2011 Elsevier Interactive Patient Education [employer].

## 2015-02-03 NOTE — ED Notes (Signed)
Pt tolerated cup of water well.

## 2015-02-03 NOTE — ED Notes (Signed)
Pt states that she started having diarrhea on Thursday.  Denies abd pain or vomiting.  States has had 4 episodes in last 24 hours, can take pepto and diarrhea will stop.

## 2015-02-03 NOTE — ED Provider Notes (Signed)
CSN: [id number]     Arrival date & time 02/03/15  [id number] History  By signing my name below, I, Mackenzie Black, attest that this documentation has been prepared under the direction and in the presence of Mackenzie Essex, MD. Electronically Signed: Altamease Black, ED Scribe. 02/03/2015. 10:33 AM   Chief Complaint  Patient presents with  . Diarrhea    The history is provided by the patient. No language interpreter was used.  Mackenzie Black is a 59 y.o. female who presents to the Emergency Department complaining of diarrhea with onset 4 days ago. Pt reports 3 episodes this morning but notes that the volume of stool has decreased since the initial onset of symptoms. The stool has been loose and light to dark brown. Pepto Bismol provided some relief in symptoms 3 days ago.  She had similar but more severe symptoms in 2014 that resolved after seeing Dr. Laural Black who reportedly thought that she had food poisoning. Associated symptoms include 1 episode of emesis 2 days ago, mild abdominal pain, and decreased appetite. Pt denies hematochezia,  measured fever, chest pain, SOB, and current lightheadedness and dizziness. She has had no known sick contact. Pt denies recent travel.  She last used abx on 12/24/14 for a respiratory infection. Pt denies history of abdominal surgery, ulcers, IBS, IBD, or Crohn's disease. Her last colonoscopy was in 2013 and it was normal. She last used Humira 2-3 months ago.  Past Medical History  Diagnosis Date  . Neuropathy ([place])   . RA (rheumatoid arthritis) ([place])   . Back pain   . Hypertension   . Hypertension    Past Surgical History  Procedure Laterality Date  . Tonsillectomy    . Excision of cyst of hand    . Tubal ligation    . Knee surgery    . Bilateral foot surgery    . Colonoscopy  03/31/2011    Procedure: COLONOSCOPY;  Surgeon: Jamesetta So, MD;  Location: [facility];  Service: Gastroenterology;  Laterality: N/A;   Family History  Problem Relation Age of  Onset  . Colon cancer Sister   . Colon cancer Brother   . Stroke Mother   . Cancer - Lung Father    Social History  Substance Use Topics  . Smoking status: Never Smoker   . Smokeless tobacco: Never Used  . Alcohol Use: No   OB History    No data available     Review of Systems 10 Systems reviewed and all are negative for acute change except as noted in the HPI.  Allergies  Humira  Home Medications   Prior to Admission medications   Medication Sig Start Date End Date Taking? Authorizing Provider  dicyclomine (BENTYL) 10 MG capsule Take 1 capsule (10 mg total) by mouth 3 (three) times daily as needed. 12/10/14  Yes Rogene Houston, MD  esomeprazole (NEXIUM) 40 MG capsule Take 40 mg by mouth daily.   Yes Historical Provider, MD  fluticasone (FLONASE) 50 MCG/ACT nasal spray Place 2 sprays into both nostrils daily as needed for allergies.    Yes Historical Provider, MD  gabapentin (NEURONTIN) 300 MG capsule Take 300 mg by mouth 2 (two) times daily.    Yes Historical Provider, MD  hydroxychloroquine (PLAQUENIL) 200 MG tablet Take 1 tablet by mouth 2 (two) times daily. 01/15/15  Yes Historical Provider, MD  traMADol (ULTRAM) 50 MG tablet Take 50 mg by mouth every 8 (eight) hours.    Yes Historical Provider, MD  BP 140/77 mmHg  Pulse 72  Temp(Src) 97.9 F (36.6 C) (Oral)  Resp 18  Ht 5\' 5"  (1.651 m)  Wt 140 lb (63.504 kg)  BMI 23.30 kg/m2  SpO2 97% Physical Exam  Constitutional: She is oriented to person, place, and time. She appears well-developed and well-nourished. No distress.  HENT:  Head: Normocephalic and atraumatic.  Mouth/Throat: Oropharynx is clear and moist. Mucous membranes are dry. No oropharyngeal exudate.  Eyes: Conjunctivae and EOM are normal. Pupils are equal, round, and reactive to light.  Neck: Normal range of motion. Neck supple.  No meningismus.  Cardiovascular: Normal rate, regular rhythm, normal heart sounds and intact distal pulses.   No murmur  heard. Pulmonary/Chest: Effort normal and breath sounds normal. No respiratory distress.  Abdominal: Soft. There is no tenderness. There is no rebound and no guarding.  Genitourinary:  Chaperone present Skin breakdown in gluteal cleft No hemorrhoids No gross blood  Musculoskeletal: Normal range of motion. She exhibits no edema or tenderness.  Neurological: She is alert and oriented to person, place, and time. No cranial nerve deficit. She exhibits normal muscle tone. Coordination normal.  No ataxia on finger to nose bilaterally. No pronator drift. 5/5 strength throughout. CN 2-12 intact.Equal grip strength. Sensation intact.   Skin: Skin is warm.  Psychiatric: She has a normal mood and affect. Her behavior is normal.  Nursing note and vitals reviewed.   ED Course  Procedures (including critical care time) DIAGNOSTIC STUDIES: Oxygen Saturation is 97% on RA,  normal by my interpretation.    COORDINATION OF CARE: 10:17 AM Discussed treatment plan which includes lab work and CT A/P with pt at bedside and pt agreed to plan.  Labs Review Labs Reviewed  URINALYSIS, ROUTINE W REFLEX MICROSCOPIC (NOT AT [facility]) - Abnormal; Notable for the following:    Specific Gravity, Urine >1.030 (*)    Bilirubin Urine SMALL (*)    Ketones, ur TRACE (*)    Protein, ur TRACE (*)    Leukocytes, UA TRACE (*)    All other components within normal limits  CBC WITH DIFFERENTIAL/PLATELET - Abnormal; Notable for the following:    Hemoglobin 15.6 (*)    All other components within normal limits  COMPREHENSIVE METABOLIC PANEL - Abnormal; Notable for the following:    Potassium 3.3 (*)    All other components within normal limits  URINE MICROSCOPIC-ADD ON - Abnormal; Notable for the following:    Squamous Epithelial / LPF 0-5 (*)    Bacteria, UA RARE (*)    Crystals CA OXALATE CRYSTALS (*)    All other components within normal limits  POC OCCULT BLOOD, ED    Imaging Review Ct Abdomen Pelvis W  Contrast  02/03/2015  CLINICAL DATA:  59 year old female with abdominal pain and diarrhea for 3 days. EXAM: CT ABDOMEN AND PELVIS WITH CONTRAST TECHNIQUE: Multidetector CT imaging of the abdomen and pelvis was performed using the standard protocol following bolus administration of intravenous contrast. CONTRAST:  167mL OMNIPAQUE IOHEXOL 300 MG/ML  SOLN COMPARISON:  09/24/2012 CT FINDINGS: Lower chest:  No acute abnormality Hepatobiliary: Mild hepatic steatosis noted. Probable gallstone noted. There is no CT evidence of acute cholecystitis. There is no evidence of biliary dilatation. Pancreas: Unremarkable Spleen: Unremarkable Adrenals/Urinary Tract: The kidneys, adrenal glands and bladder are unremarkable. Stomach/Bowel: Unremarkable. There is no evidence of bowel obstruction or focal bowel wall thickening. The appendix is normal. Vascular/Lymphatic: Unremarkable. No enlarged lymph nodes or abdominal aortic aneurysm. Reproductive: The uterus and adnexal regions are  unremarkable. Other: No free fluid, pneumoperitoneum or abscess. Musculoskeletal: No acute or suspicious abnormalities. IMPRESSION: No evidence of acute abnormality. Mild hepatic steatosis and probable cholelithiasis. No CT evidence of acute cholecystitis. Electronically Signed   By: Margarette Canada M.D.   On: 02/03/2015 13:14   I personally reviewed and evaluated these images and lab results as a part of my medical decision-making.    EKG Interpretation None      MDM   Final diagnoses:  Diarrhea, unspecified type  3 days of diarrhea, improving.  No recent travel.  Last antibiotic use 6 weeks ago.  No abdominal pain or fever.  Abdomen soft and nontender.  FOBT negative.  Labs remarkable for mild hypokalemia which was repeated. Abdomen is soft and nontender. No episodes of diarrhea in the ED. No vomiting. She is tolerating by mouth.  She is scheduled for an EGD in 3 days. Advised to call her GI doctor in the morning.Burnis Medin treat her  diarrhea symptomatically at this point. No indication for antibiotics.  Return precautions discussed.   I personally performed the services described in this documentation, which was scribed in my presence. The recorded information has been reviewed and is accurate.   Mackenzie Essex, MD 02/03/15 [id number]

## 2015-02-05 ENCOUNTER — Ambulatory Visit (INDEPENDENT_AMBULATORY_CARE_PROVIDER_SITE_OTHER): Payer: Medicare Other | Admitting: Internal Medicine

## 2015-02-06 ENCOUNTER — Ambulatory Visit (INDEPENDENT_AMBULATORY_CARE_PROVIDER_SITE_OTHER): Payer: Medicare Other | Admitting: Internal Medicine

## 2015-02-06 ENCOUNTER — Ambulatory Visit (HOSPITAL_COMMUNITY): Admission: RE | Admit: 2015-02-06 | Payer: Medicare Other | Source: Ambulatory Visit | Admitting: Internal Medicine

## 2015-02-06 ENCOUNTER — Encounter (HOSPITAL_COMMUNITY): Admission: RE | Payer: Self-pay | Source: Ambulatory Visit

## 2015-02-06 SURGERY — EGD (ESOPHAGOGASTRODUODENOSCOPY)
Anesthesia: Moderate Sedation

## 2015-03-12 ENCOUNTER — Encounter (INDEPENDENT_AMBULATORY_CARE_PROVIDER_SITE_OTHER): Payer: Self-pay | Admitting: Internal Medicine

## 2015-03-12 ENCOUNTER — Ambulatory Visit (INDEPENDENT_AMBULATORY_CARE_PROVIDER_SITE_OTHER): Payer: Medicare Other | Admitting: Internal Medicine

## 2015-03-12 VITALS — BP 120/90 | HR 72 | Resp 18 | Ht 67.0 in | Wt 146.8 lb

## 2015-03-12 DIAGNOSIS — K219 Gastro-esophageal reflux disease without esophagitis: Secondary | ICD-10-CM | POA: Diagnosis not present

## 2015-03-12 DIAGNOSIS — R1030 Lower abdominal pain, unspecified: Secondary | ICD-10-CM | POA: Diagnosis not present

## 2015-03-12 DIAGNOSIS — R1310 Dysphagia, unspecified: Secondary | ICD-10-CM

## 2015-03-12 NOTE — Progress Notes (Signed)
Presenting complaint;  Follow-up for GERD and dysphagia.  Subjective:  Patient is 60 year old Caucasian female who was last seen on 02/09/2015 for symptoms of GERD and solid food dysphagia. She was begun on esomeprazole 40 mg daily and scheduled to undergo EGD which had to be postponed because patient developed diarrhea. Patient has history of rheumatoid arthritis and had been on Humira. She felt it was not working. She was seen by rheumatologist Dr. Gavin Pound on 01/15/2015 and was begun on Plaquanil which she stopped after 2 weeks because of diarrhea and abdominal pain. Her symptoms persisted even after stopping this medication and she was seen in emergency room on 02/03/2015 and workup was negative including lab studies and abdominopelvic CT. She has not contact on plaque with no and did not inform Dr. Trudie Reed but she has an appointment in about 4 weeks. She states heartburn is well controlled with therapy and she is not having any side effects. She has had one episode of dysphagia while she was eating bread since her last visit. She states her diarrhea has finally improved. Her bowels however there are irregular. She may have 1-2 formed stools and then Ms. skip a day or 2 and she denies melena or rectal bleeding. Her weight remains stable.   Current Medications: Outpatient Encounter Prescriptions as of 03/12/2015  Medication Sig  . dicyclomine (BENTYL) 10 MG capsule Take 1 capsule (10 mg total) by mouth 3 (three) times daily as needed.  Marland Kitchen esomeprazole (NEXIUM) 40 MG capsule Take 40 mg by mouth daily.  . fluticasone (FLONASE) 50 MCG/ACT nasal spray Place 2 sprays into both nostrils daily as needed for allergies.   Marland Kitchen gabapentin (NEURONTIN) 300 MG capsule Take 300 mg by mouth 2 (two) times daily.   Marland Kitchen OVER THE COUNTER MEDICATION as needed. Alka - Seltzer Plus  . traMADol (ULTRAM) 50 MG tablet Take 50 mg by mouth every 8 (eight) hours.   . hydroxychloroquine (PLAQUENIL) 200 MG tablet Take 1  tablet by mouth 2 (two) times daily. Reported on 03/12/2015   No facility-administered encounter medications on file as of 03/12/2015.     Objective: Blood pressure 120/90, pulse 72, resp. rate 18, height 5\' 7"  (1.702 m), weight 146 lb 12.8 oz (66.588 kg). Patient is alert and in no acute distress. Conjunctiva is pink. Sclera is nonicteric Oropharyngeal mucosa is normal. No neck masses or thyromegaly noted. Cardiac exam with regular rhythm normal S1 and S2. No murmur or gallop noted. Lungs are clear to auscultation. Abdomen is symmetrical and soft with mild tenderness at LLQ. No organomegaly or masses. No LE edema or clubbing noted.  Lab data: Abdominopelvic CT from 02/03/2015 reviewed. It shows cholelithiasis and fatty liver.  Lab data from 02/03/2015 WBC 7.3, H&H 15.6 and 45.6 and platelet count 257K Comprehensive chemistry panel normal except serum potassium of 3.3  Assessment:  #1. Esophageal dysphagia. Dysphagia has improved but not resolved since she has been on PPI. #2. GERD. Heartburn is well controlled with therapy. #3. Lower abdominal pain possibly do to IBS. Recent bout of diarrhea may arm and not be related to plaquenil. She maybe had food poisoning or widened illness. She is reluctant to go back on plaquenil until she has seen Dr. Gavin Pound. Last colonoscopy was in March 2013 and next one will be due in March 2018 because of family history of CRC in 2 first-degree analysis #4. Asymptomatic cholelithiasis.   Plan:  Continue Esomeprazole at 40 mg by mouth every morning. Esophagogastroduodenoscopy with esophageal dilation  to be scheduled in near future. Dicyclomine 10 mg by mouth 3 times a day when necessary. Office visit in 6 months.

## 2015-03-12 NOTE — Patient Instructions (Addendum)
Please call office to schedule esophagogastroduodenoscopy and dilation.

## 2015-03-13 ENCOUNTER — Encounter (INDEPENDENT_AMBULATORY_CARE_PROVIDER_SITE_OTHER): Payer: Self-pay | Admitting: Internal Medicine

## 2015-06-19 ENCOUNTER — Encounter (INDEPENDENT_AMBULATORY_CARE_PROVIDER_SITE_OTHER): Payer: Self-pay | Admitting: *Deleted

## 2015-06-19 ENCOUNTER — Other Ambulatory Visit (INDEPENDENT_AMBULATORY_CARE_PROVIDER_SITE_OTHER): Payer: Self-pay | Admitting: *Deleted

## 2015-06-19 DIAGNOSIS — K219 Gastro-esophageal reflux disease without esophagitis: Secondary | ICD-10-CM

## 2015-06-19 DIAGNOSIS — R1310 Dysphagia, unspecified: Secondary | ICD-10-CM

## 2015-07-12 ENCOUNTER — Ambulatory Visit (HOSPITAL_COMMUNITY)
Admission: RE | Admit: 2015-07-12 | Discharge: 2015-07-12 | Disposition: A | Discharge status: Home / Self Care | Payer: Medicare Other | Source: Ambulatory Visit | Attending: Internal Medicine | Admitting: Internal Medicine

## 2015-07-12 ENCOUNTER — Encounter (HOSPITAL_COMMUNITY): Payer: Self-pay | Admitting: *Deleted

## 2015-07-12 ENCOUNTER — Encounter (HOSPITAL_COMMUNITY)
Admission: RE | Disposition: A | Discharge status: Home / Self Care | Payer: Self-pay | Source: Ambulatory Visit | Attending: Internal Medicine

## 2015-07-12 DIAGNOSIS — Z87891 Personal history of nicotine dependence: Secondary | ICD-10-CM | POA: Diagnosis not present

## 2015-07-12 DIAGNOSIS — K219 Gastro-esophageal reflux disease without esophagitis: Secondary | ICD-10-CM

## 2015-07-12 DIAGNOSIS — Z79899 Other long term (current) drug therapy: Secondary | ICD-10-CM | POA: Insufficient documentation

## 2015-07-12 DIAGNOSIS — K2270 Barrett's esophagus without dysplasia: Secondary | ICD-10-CM | POA: Insufficient documentation

## 2015-07-12 DIAGNOSIS — K228 Other specified diseases of esophagus: Secondary | ICD-10-CM | POA: Diagnosis not present

## 2015-07-12 DIAGNOSIS — M069 Rheumatoid arthritis, unspecified: Secondary | ICD-10-CM | POA: Insufficient documentation

## 2015-07-12 DIAGNOSIS — K222 Esophageal obstruction: Secondary | ICD-10-CM | POA: Insufficient documentation

## 2015-07-12 DIAGNOSIS — R1310 Dysphagia, unspecified: Secondary | ICD-10-CM | POA: Insufficient documentation

## 2015-07-12 DIAGNOSIS — G629 Polyneuropathy, unspecified: Secondary | ICD-10-CM | POA: Insufficient documentation

## 2015-07-12 DIAGNOSIS — K210 Gastro-esophageal reflux disease with esophagitis: Secondary | ICD-10-CM | POA: Insufficient documentation

## 2015-07-12 DIAGNOSIS — Z7982 Long term (current) use of aspirin: Secondary | ICD-10-CM | POA: Diagnosis not present

## 2015-07-12 DIAGNOSIS — K449 Diaphragmatic hernia without obstruction or gangrene: Secondary | ICD-10-CM

## 2015-07-12 DIAGNOSIS — Z800 Family history of malignant neoplasm of digestive organs: Secondary | ICD-10-CM | POA: Diagnosis not present

## 2015-07-12 DIAGNOSIS — R1314 Dysphagia, pharyngoesophageal phase: Secondary | ICD-10-CM | POA: Diagnosis not present

## 2015-07-12 HISTORY — PX: ESOPHAGOGASTRODUODENOSCOPY: SHX5428

## 2015-07-12 HISTORY — PX: ESOPHAGEAL DILATION: SHX303

## 2015-07-12 SURGERY — EGD (ESOPHAGOGASTRODUODENOSCOPY)
Anesthesia: Moderate Sedation

## 2015-07-12 MED ORDER — MEPERIDINE HCL 50 MG/ML IJ SOLN
INTRAMUSCULAR | Status: AC
  Filled 2015-07-12: qty 1

## 2015-07-12 MED ORDER — MEPERIDINE HCL 50 MG/ML IJ SOLN
INTRAMUSCULAR | Status: DC | PRN
  Administered 2015-07-12 (×2): 25 mg via INTRAVENOUS

## 2015-07-12 MED ORDER — MIDAZOLAM HCL 5 MG/5ML IJ SOLN
INTRAMUSCULAR | Status: AC
  Filled 2015-07-12: qty 10

## 2015-07-12 MED ORDER — MIDAZOLAM HCL 5 MG/5ML IJ SOLN
INTRAMUSCULAR | Status: DC | PRN
  Administered 2015-07-12: 2 mg via INTRAVENOUS
  Administered 2015-07-12: 1 mg via INTRAVENOUS
  Administered 2015-07-12 (×2): 2 mg via INTRAVENOUS
  Administered 2015-07-12: 1 mg via INTRAVENOUS

## 2015-07-12 MED ORDER — BUTAMBEN-TETRACAINE-BENZOCAINE 2-2-14 % EX AERO
INHALATION_SPRAY | CUTANEOUS | Status: DC | PRN
  Administered 2015-07-12: 2 via TOPICAL

## 2015-07-12 MED ORDER — SODIUM CHLORIDE 0.9 % IV SOLN
INTRAVENOUS | Status: DC
  Administered 2015-07-12: 12:00:00 via INTRAVENOUS

## 2015-07-12 MED ORDER — STERILE WATER FOR IRRIGATION IR SOLN
Status: DC | PRN
  Administered 2015-07-12: 2.5 mL

## 2015-07-12 NOTE — H&P (Signed)
Mackenzie Black is an 60 y.o. female.   Chief Complaint: Patient is here for EGD and ED. HPI: Patient is 60 year old Caucasian female was chronic GERD and presents with few months history of solid food dysphagia. Heartburns well controlled with therapy. She feels bolus gets stuck at suprasternal area and midsternal area and eventually passes down. She denies nausea vomiting melena or abdominal pain. History is also significant for rheumatoid arthritis. She is presently on plaque on prednisone.  Past Medical History  Diagnosis Date  . Neuropathy ([place])   . RA (rheumatoid arthritis) ([place])   . Back pain     Past Surgical History  Procedure Laterality Date  . Tonsillectomy    . Excision of cyst of hand    . Tubal ligation    . Knee surgery    . Bilateral foot surgery    . Colonoscopy  03/31/2011    Procedure: COLONOSCOPY;  Surgeon: Jamesetta So, MD;  Location: [facility];  Service: Gastroenterology;  Laterality: N/A;    Family History  Problem Relation Age of Onset  . Colon cancer Sister   . Colon cancer Brother   . Stroke Mother   . Cancer - Lung Father    Social History:  reports that she has quit smoking. Her smoking use included Cigarettes. She has a .5 pack-year smoking history. She has never used smokeless tobacco. She reports that she does not drink alcohol or use illicit drugs.  Allergies:  Allergies  Allergen Reactions  . Humira [Adalimumab]     rash    Medications Prior to Admission  Medication Sig Dispense Refill  . Aspirin-Caffeine (BC FAST PAIN RELIEF) 845-65 MG PACK Take by mouth.    . dicyclomine (BENTYL) 10 MG capsule Take 1 capsule (10 mg total) by mouth 3 (three) times daily as needed. (Patient taking differently: Take 10 mg by mouth 3 (three) times daily as needed for spasms. ) 60 capsule 1  . esomeprazole (NEXIUM) 40 MG capsule Take 40 mg by mouth daily.    . fluticasone (FLONASE) 50 MCG/ACT nasal spray Place 2 sprays into both nostrils daily as needed  for allergies.     Marland Kitchen gabapentin (NEURONTIN) 300 MG capsule Take 300 mg by mouth 4 (four) times daily as needed (pain).     . hydroxychloroquine (PLAQUENIL) 200 MG tablet Take 1 tablet by mouth 2 (two) times daily. Reported on 03/12/2015    . traMADol (ULTRAM) 50 MG tablet Take 50 mg by mouth 2 (two) times daily as needed for moderate pain.       No results found for this or any previous visit (from the past 48 hour(s)). No results found.  ROS  Blood pressure 156/77, pulse 69, temperature 97.4 F (36.3 C), temperature source Oral, resp. rate 15, height 5\' 5"  (1.651 m), weight 148 lb (67.132 kg), SpO2 100 %. Physical Exam  Constitutional: She appears well-developed and well-nourished.  HENT:  Mouth/Throat: Oropharynx is clear and moist.  Eyes: Conjunctivae are normal. No scleral icterus.  Neck: No thyromegaly present.  Cardiovascular: Normal rate, regular rhythm and normal heart sounds.   No murmur heard. GI: Soft. She exhibits no distension and no mass. There is no tenderness.  Musculoskeletal: She exhibits no edema.  Lymphadenopathy:    She has no cervical adenopathy.  Neurological: She is alert.  Skin: Skin is warm and dry.     Assessment/Plan Solid food dysphagia in patient with chronic GERD. EGD with ED.  Hildred Laser, MD 07/12/2015, 12:32  PM

## 2015-07-12 NOTE — Discharge Instructions (Signed)
No aspirin or NSAIDs for 3 days. Resume other medications as before. Resume usual diet. No driving for 24 hours. Physician will call with biopsy results.          Esophagogastroduodenoscopy, Care After Refer to this sheet in the next few weeks. These instructions provide you with information about caring for yourself after your procedure. Your health care provider may also give you more specific instructions. Your treatment has been planned according to current medical practices, but problems sometimes occur. Call your health care provider if you have any problems or questions after your procedure. WHAT TO EXPECT AFTER THE PROCEDURE After your procedure, it is typical to feel:  Soreness in your throat.  Pain with swallowing.  Sick to your stomach (nauseous).  Bloated.  Dizzy.  Fatigued. HOME CARE INSTRUCTIONS  Do not eat or drink anything until the numbing medicine (local anesthetic) has worn off and your gag reflex has returned. You will know that the local anesthetic has worn off when you can swallow comfortably.  Do not drive or operate machinery until directed by your health care provider.  Take medicines only as directed by your health care provider. SEEK MEDICAL CARE IF:   You cannot stop coughing.  You are not urinating at all or less than usual. SEEK IMMEDIATE MEDICAL CARE IF:  You have difficulty swallowing.  You cannot eat or drink.  You have worsening throat or chest pain.  You have dizziness or lightheadedness or you faint.  You have nausea or vomiting.  You have chills.  You have a fever.  You have severe abdominal pain.  You have black, tarry, or bloody stools.   This information is not intended to replace advice given to you by your health care provider. Make sure you discuss any questions you have with your health care provider.   Document Released: 01/20/2012 Document Revised: 02/23/2014 Document Reviewed: 01/20/2012 Elsevier  Interactive Patient Education 2016 Else[employer]    Hiatal Hernia A hiatal hernia occurs when part of your stomach slides above the muscle that separates your abdomen from your chest (diaphragm). You can be born with a hiatal hernia (congenital), or it may develop over time. In almost all cases of hiatal hernia, only the top part of the stomach pushes through.  Many people have a hiatal hernia with no symptoms. The larger the hernia, the more likely that you will have symptoms. In some cases, a hiatal hernia allows stomach acid to flow back into the tube that carries food from your mouth to your stomach (esophagus). This may cause heartburn symptoms. Severe heartburn symptoms may mean you have developed a condition called gastroesophageal reflux disease (GERD).  CAUSES  Hiatal hernias are caused by a weakness in the opening (hiatus) where your esophagus passes through your diaphragm to attach to the upper part of your stomach. You may be born with a weakness in your hiatus, or a weakness can develop. RISK FACTORS Older age is a major risk factor for a hiatal hernia. Anything that increases pressure on your diaphragm can also increase your risk of a hiatal hernia. This includes:  Pregnancy.  Excess weight.  Frequent constipation. SIGNS AND SYMPTOMS  People with a hiatal hernia often have no symptoms. If symptoms develop, they are almost always caused by GERD. They may include:  Heartburn.  Belching.  Indigestion.  Trouble swallowing.  Coughing or wheezing.  Sore throat.  Hoarseness.  Chest pain. DIAGNOSIS  A hiatal hernia is sometimes found during an exam for  another problem. Your health care provider may suspect a hiatal hernia if you have symptoms of GERD. Tests may be done to diagnose GERD. These may include:  X-rays of your stomach or chest.  An upper gastrointestinal (GI) series. This is an X-ray exam of your GI tract involving the use of a chalky liquid that you  swallow. The liquid shows up clearly on the X-ray.  Endoscopy. This is a procedure to look into your stomach using a thin, flexible tube that has a tiny camera and light on the end of it. TREATMENT  If you have no symptoms, you may not need treatment. If you have symptoms, treatment may include:  Dietary and lifestyle changes to help reduce GERD symptoms.  Medicines. These may include:  Over-the-counter antacids.  Medicines that make your stomach empty more quickly.  Medicines that block the production of stomach acid (H2 blockers).  Stronger medicines to reduce stomach acid (proton pump inhibitors).  You may need surgery to repair the hernia if other treatments are not helping. HOME CARE INSTRUCTIONS   Take all medicines as directed by your health care provider.  Quit smoking, if you smoke.  Try to achieve and maintain a healthy body weight.  Eat frequent small meals instead of three large meals a day. This keeps your stomach from getting too full.  Eat slowly.  Do not lie down right after eating.  Do noteat 1-2 hours before bed.   Do not drink beverages with caffeine. These include cola, coffee, cocoa, and tea.  Do not drink alcohol.  Avoid foods that can make symptoms of GERD worse. These may include:  Fatty foods.  Citrus fruits.  Other foods and drinks that contain acid.  Avoid putting pressure on your belly. Anything that puts pressure on your belly increases the amount of acid that may be pushed up into your esophagus.   Avoid bending over, especially after eating.  Raise the head of your bed by putting blocks under the legs. This keeps your head and esophagus higher than your stomach.  Do not wear tight clothing around your chest or stomach.  Try not to strain when having a bowel movement, when urinating, or when lifting heavy objects. SEEK MEDICAL CARE IF:  Your symptoms are not controlled with medicines or lifestyle changes.  You are having  trouble swallowing.  You have coughing or wheezing that will not go away. SEEK IMMEDIATE MEDICAL CARE IF:  Your pain is getting worse.  Your pain spreads to your arms, neck, jaw, teeth, or back.  You have shortness of breath.  You sweat for no reason.  You feel sick to your stomach (nauseous) or vomit.  You vomit blood.  You have bright red blood in your stools.  You have black, tarry stools.    This information is not intended to replace advice given to you by your health care provider. Make sure you discuss any questions you have with your health care provider.   Document Released: 04/25/2003 Document Revised: 02/23/2014 Document Reviewed: 01/20/2013 Elsevier Interactive Patient Education [employer].

## 2015-07-12 NOTE — Op Note (Signed)
[facility] Patient Name: Mackenzie Black Procedure Date: 07/12/2015 12:34 PM MRN: [id number] Date of Birth: 1956/01/17 Attending MD: Hildred Laser , MD CSN: [id number] Age: 60 Admit Type: Outpatient Procedure:                Upper GI endoscopy Indications:              Esophageal dysphagia, Reflux esophagitis Providers:                Hildred Laser, MD, Lurline Del, RN, Isabella Stalling,                            Technician Referring MD:             Newt Minion, MD Medicines:                Cetacaine spray, Meperidine 50 mg IV, Midazolam 8                            mg IV Complications:            No immediate complications. Estimated Blood Loss:     Estimated blood loss: 5 mL. Procedure:                Pre-Anesthesia Assessment:                           - Prior to the procedure, a History and Physical                            was performed, and patient medications and                            allergies were reviewed. The patient's tolerance of                            previous anesthesia was also reviewed. The risks                            and benefits of the procedure and the sedation                            options and risks were discussed with the patient.                            All questions were answered, and informed consent                            was obtained. Prior Anticoagulants: The patient                            last took aspirin 7 days prior to the procedure.                            ASA Grade Assessment: III - A patient with severe  systemic disease. After reviewing the risks and                            benefits, the patient was deemed in satisfactory                            condition to undergo the procedure.                           After obtaining informed consent, the endoscope was                            passed under direct vision. Throughout the                            procedure, the  patient's blood pressure, pulse, and                            oxygen saturations were monitored continuously. The                            [id number]) scope was introduced through the                            mouth, and advanced to the second part of duodenum.                            The upper GI endoscopy was accomplished without                            difficulty. The patient tolerated the procedure                            well. Scope In: 12:44:58 PM Scope Out: 1:00:09 PM Total Procedure Duration: 0 hours 15 minutes 11 seconds  Findings:      The upper third of the esophagus and middle third of the esophagus were       normal.      One tongue of salmon-colored mucosa was present. Biopsies were taken       with a cold forceps for histology.      A low-grade of narrowing Schatzki ring (acquired) was found at the       gastroesophageal junction. Dilation was performed with a Maloney dilator       with no resistance at 3 Fr. The dilation site was examined and showed       moderate improvement in luminal narrowing.      A 3 cm hiatal hernia was present.      The entire examined stomach was normal.      The first and second part of the duodenum normal.. Impression:               - Normal upper third of esophagus and middle third                            of esophagus.                           -  single patch of Salmon-colored mucosan contiguous                            with GE junction. Biopsied.                           - Schatzki's ring and 3 cm hiatal hernia.                           - Normal stomach.                           - Normal first and second portion of the duodenum. Moderate Sedation:      Moderate (conscious) sedation was administered by the endoscopy nurse       and supervised by the endoscopist. The following parameters were       monitored: oxygen saturation, heart rate, blood pressure, CO2       capnography and response to care. Total  physician intraservice time was       24 minutes. Recommendation:           - Patient has a contact number available for                            emergencies. The signs and symptoms of potential                            delayed complications were discussed with the                            patient. Return to normal activities tomorrow.                            Written discharge instructions were provided to the                            patient.                           - Resume previous diet today.                           - Continue present medications.                           - No aspirin, ibuprofen, naproxen, or other                            non-steroidal anti-inflammatory drugs for 3 days.                           - Await pathology results. Procedure Code(s):        --- Professional ---                           [phone], Esophagogastroduodenoscopy, flexible,  transoral; with biopsy, single or multiple                           43450, Dilation of esophagus, by unguided sound or                            bougie, single or multiple passes                           99152, Moderate sedation services provided by the                            same physician or other qualified health care                            professional performing the diagnostic or                            therapeutic service that the sedation supports,                            requiring the presence of an independent trained                            observer to assist in the monitoring of the                            patient's level of consciousness and physiological                            status; initial 15 minutes of intraservice time,                            patient age 77 years or older                           [phone], Moderate sedation services; each additional                            15 minutes intraservice time Diagnosis Code(s):        ---  Professional ---                           K22.8, Other specified diseases of esophagus                           K44.9, Diaphragmatic hernia without obstruction or                            gangrene                           R13.14, Dysphagia, pharyngoesophageal phase                           K21.0, Gastro-esophageal reflux  disease with                            esophagitis CPT copyright 2016 [employer]. All rights reserved. The codes documented in this report are preliminary and upon coder review may  be revised to meet current compliance requirements. Hildred Laser, MD Hildred Laser, MD 07/12/2015 1:19:22 PM This report has been signed electronically. Number of Addenda: 0

## 2015-07-19 ENCOUNTER — Encounter (HOSPITAL_COMMUNITY): Payer: Self-pay | Admitting: Internal Medicine

## 2015-08-17 ENCOUNTER — Other Ambulatory Visit (INDEPENDENT_AMBULATORY_CARE_PROVIDER_SITE_OTHER): Payer: Self-pay | Admitting: Internal Medicine

## 2015-08-27 ENCOUNTER — Other Ambulatory Visit (INDEPENDENT_AMBULATORY_CARE_PROVIDER_SITE_OTHER): Payer: Self-pay | Admitting: Internal Medicine

## 2015-09-10 ENCOUNTER — Ambulatory Visit (INDEPENDENT_AMBULATORY_CARE_PROVIDER_SITE_OTHER): Payer: Medicare Other | Admitting: Internal Medicine

## 2015-09-10 ENCOUNTER — Encounter (INDEPENDENT_AMBULATORY_CARE_PROVIDER_SITE_OTHER): Payer: Self-pay | Admitting: Internal Medicine

## 2015-09-10 VITALS — BP 124/76 | HR 68 | Temp 97.6°F | Resp 18 | Ht 67.0 in | Wt 151.7 lb

## 2015-09-10 DIAGNOSIS — R1033 Periumbilical pain: Secondary | ICD-10-CM | POA: Diagnosis not present

## 2015-09-10 DIAGNOSIS — K2270 Barrett's esophagus without dysplasia: Secondary | ICD-10-CM | POA: Diagnosis not present

## 2015-09-10 DIAGNOSIS — K219 Gastro-esophageal reflux disease without esophagitis: Secondary | ICD-10-CM

## 2015-09-10 MED ORDER — ESOMEPRAZOLE MAGNESIUM 40 MG PO CPDR: 40.0000 mg | DELAYED_RELEASE_CAPSULE | Freq: Two times a day (BID) | ORAL | 2 refills | Status: DC

## 2015-09-10 NOTE — Progress Notes (Signed)
Presenting complaint;  Follow-up for GERD and abdominal pain.  Subjective:  Patient is 60 year old Caucasian female who is here for scheduled visit. She underwent EGD 2 months ago for dysphagia and symptoms of GERD. He was found has short segment Barrett's esophagus Schatzki's ring and 3 cm size hiatal hernia. Esophagus was dilated by passing 54 [place] Maloney dilator. She has been maintained on as omeprazole and is here for follow-up visit. She says she is able to swallow better but it is not 100% improved. She has most difficulty when she eats sandwiches. She continues to have heartburn 1-3 times a week. She is using Pepto-Bismol. She continues to complain of coughing and throat irritation. She also has noted hoarseness but denies regurgitation or vomiting. She continues to complain of mid abdominal pain. She states it is not as frequent. She denies melena or rectal bleeding. She is watching her diet. She stays away from fatty and fried foods as well as caffeine containing products.      Current Medications: Outpatient Encounter Prescriptions as of 09/10/2015  Medication Sig  . Aspirin-Caffeine (BC FAST PAIN RELIEF) 845-65 MG PACK Take 65 mg by mouth. Patient states that she takes a BC 1 to 2 times a week for headache  . dicyclomine (BENTYL) 10 MG capsule Take 1 capsule (10 mg total) by mouth 3 (three) times daily as needed. (Patient taking differently: Take 10 mg by mouth 3 (three) times daily as needed for spasms. )  . fluticasone (FLONASE) 50 MCG/ACT nasal spray Place 2 sprays into both nostrils daily as needed for allergies.   Marland Kitchen gabapentin (NEURONTIN) 300 MG capsule Take 300 mg by mouth 4 (four) times daily as needed (pain).   . hydroxychloroquine (PLAQUENIL) 200 MG tablet Take 1 tablet by mouth 2 (two) times daily. Reported on 03/12/2015  . NEXIUM 40 MG capsule TAKE (1) CAPSULE BY MOUTH ONCE DAILY BEFORE BREAKFAST.  Marland Kitchen PRESCRIPTION MEDICATION Patient is getting 2 cc of .05% Marcaine 1 cc  1000 mcg B12 By Dr. Berline Lopes once a month. Foot Block  . [DISCONTINUED] traMADol (ULTRAM) 50 MG tablet Take 50 mg by mouth 2 (two) times daily as needed for moderate pain.    No facility-administered encounter medications on file as of 09/10/2015.      Objective: Blood pressure 124/76, pulse 68, temperature 97.6 F (36.4 C), temperature source Oral, resp. rate 18, height 5\' 7"  (1.702 m), weight 151 lb 11.2 oz (68.8 kg). Patient is alert and in no acute distress. Conjunctiva is pink. Sclera is nonicteric Oropharyngeal mucosa is normal. No neck masses or thyromegaly noted. Cardiac exam with regular rhythm normal S1 and S2. No murmur or gallop noted. Lungs are clear to auscultation. Abdomen is symmetrical and soft with week. Umbilicus tenderness. No organomegaly or masses. No LE edema or clubbing noted.   Assessment:  #1. GERD indicated by short segment Barrett's esophagus without dysplasia. Symptoms are not well controlled with therapy. She appears to be following dietary recommendations. #2. Midabdominal pain possibly do to IBS.   Plan:  Increase as omeprazole to 40 mg by mouth twice a day. Patient will call with progress report in one month. Office visit in 3 months.Marland Kitchen

## 2015-09-10 NOTE — Patient Instructions (Signed)
Please call office with progress report in one months.

## 2015-09-26 ENCOUNTER — Other Ambulatory Visit (HOSPITAL_COMMUNITY): Payer: Self-pay | Admitting: Family Medicine

## 2015-09-26 DIAGNOSIS — Z1231 Encounter for screening mammogram for malignant neoplasm of breast: Secondary | ICD-10-CM

## 2015-10-09 ENCOUNTER — Telehealth (INDEPENDENT_AMBULATORY_CARE_PROVIDER_SITE_OTHER): Payer: Self-pay | Admitting: Internal Medicine

## 2015-10-09 NOTE — Telephone Encounter (Signed)
Patient presented to the office and stated that she saw Dr. Karie Kirks last week and he wanted her to go back on Atorvastatin 20mg .  The patient wants to make sure it's ok for her to go back on this.  [phone]

## 2015-10-09 NOTE — Telephone Encounter (Signed)
This will need to be addressed with Dr.Rehman. Patient will be contacted with his recommendation.

## 2015-10-11 NOTE — Telephone Encounter (Signed)
Per Dr.Rehman patient may go back on this medication. As. Dr.Knowlton will monitor patient's labs.  Patient was called and made aware, and she voiced that nurse from [facility] office called her yesterday and told her they would do labs in 1 month. I ask that we get a copy of the results.

## 2015-10-30 ENCOUNTER — Ambulatory Visit (HOSPITAL_COMMUNITY): Payer: Medicare Other

## 2015-11-02 ENCOUNTER — Other Ambulatory Visit (INDEPENDENT_AMBULATORY_CARE_PROVIDER_SITE_OTHER): Payer: Self-pay | Admitting: Internal Medicine

## 2015-11-02 DIAGNOSIS — R1030 Lower abdominal pain, unspecified: Secondary | ICD-10-CM

## 2015-11-05 ENCOUNTER — Other Ambulatory Visit (HOSPITAL_COMMUNITY): Payer: Self-pay | Admitting: Family Medicine

## 2015-11-05 DIAGNOSIS — Z1231 Encounter for screening mammogram for malignant neoplasm of breast: Secondary | ICD-10-CM

## 2015-11-06 ENCOUNTER — Ambulatory Visit: Payer: Medicare Other

## 2015-11-06 ENCOUNTER — Ambulatory Visit (HOSPITAL_COMMUNITY)
Admission: RE | Admit: 2015-11-06 | Discharge: 2015-11-06 | Disposition: A | Discharge status: Home / Self Care | Payer: Medicare Other | Source: Ambulatory Visit | Attending: Family Medicine | Admitting: Family Medicine

## 2015-11-06 ENCOUNTER — Ambulatory Visit (HOSPITAL_COMMUNITY): Payer: Medicare Other

## 2015-11-06 DIAGNOSIS — Z1231 Encounter for screening mammogram for malignant neoplasm of breast: Secondary | ICD-10-CM | POA: Insufficient documentation

## 2015-12-17 ENCOUNTER — Encounter (INDEPENDENT_AMBULATORY_CARE_PROVIDER_SITE_OTHER): Payer: Self-pay

## 2015-12-17 ENCOUNTER — Encounter (INDEPENDENT_AMBULATORY_CARE_PROVIDER_SITE_OTHER): Payer: Self-pay | Admitting: Internal Medicine

## 2015-12-17 ENCOUNTER — Ambulatory Visit (INDEPENDENT_AMBULATORY_CARE_PROVIDER_SITE_OTHER): Payer: Medicare Other | Admitting: Internal Medicine

## 2015-12-17 VITALS — BP 122/70 | HR 68 | Temp 97.7°F | Resp 18 | Ht 67.0 in | Wt 156.8 lb

## 2015-12-17 DIAGNOSIS — K589 Irritable bowel syndrome without diarrhea: Secondary | ICD-10-CM

## 2015-12-17 DIAGNOSIS — K2270 Barrett's esophagus without dysplasia: Secondary | ICD-10-CM | POA: Diagnosis not present

## 2015-12-17 DIAGNOSIS — K219 Gastro-esophageal reflux disease without esophagitis: Secondary | ICD-10-CM | POA: Diagnosis not present

## 2015-12-17 DIAGNOSIS — K760 Fatty (change of) liver, not elsewhere classified: Secondary | ICD-10-CM

## 2015-12-17 LAB — HEPATIC FUNCTION PANEL
ALK PHOS: 116 U/L (ref 33–130)
ALT: 27 U/L (ref 6–29)
AST: 24 U/L (ref 10–35)
Albumin: 4.2 g/dL (ref 3.6–5.1)
BILIRUBIN INDIRECT: 0.1 mg/dL — AB (ref 0.2–1.2)
Bilirubin, Direct: 0.1 mg/dL (ref <=0.2)
TOTAL PROTEIN: 6.9 g/dL (ref 6.1–8.1)
Total Bilirubin: 0.2 mg/dL (ref 0.2–1.2)

## 2015-12-17 NOTE — Progress Notes (Signed)
Presenting complaint;  Follow-up for chronic GERD and IBS.  Database and Subjective:  Patient is 60-year-old Caucasian female was chronic GERD who underwent EGD with dilation for dysphagia. She was noted to have a single patch of salmon colored mucosa and biopsy confirmed Barrett's. She also had Schatzki's ring and a small sliding hiatal hernia. Esophagus was dilated by passing 54 [place] Maloney dilator.  She now returns for scheduled visit. She feels much better. She does not recall the last time she had heartburn or regurgitation. She occasionally strangles on saliva but not with food. She is using dicyclomine on as-needed basis. She uses when she has pain at left low quadrant for abdomen or urgency with diarrhea. On Sundays she has as many as 3-5 stools per day. She denies nocturnal bowel movement melena or rectal bleeding. She reassures me that she does not take sinus headache medication often. She denies epigastric, right upper quadrant or chest pain.     Current Medications: Outpatient Encounter Prescriptions as of 12/17/2015  Medication Sig  . Aspirin-Caffeine (BC FAST PAIN RELIEF) 845-65 MG PACK Take 65 mg by mouth. Patient states that she takes a BC 1 to 2 times a week for headache  . atorvastatin (LIPITOR) 20 MG tablet Take 20 mg by mouth daily.   Marland Kitchen dicyclomine (BENTYL) 10 MG capsule TAKE (1) CAPSULE THREE TIMES DAILY AS NEEDED.  Marland Kitchen esomeprazole (NEXIUM) 40 MG capsule Take 1 capsule (40 mg total) by mouth 2 (two) times daily before a meal.  . fluticasone (FLONASE) 50 MCG/ACT nasal spray Place 2 sprays into both nostrils daily as needed for allergies.   Marland Kitchen gabapentin (NEURONTIN) 300 MG capsule Take 300 mg by mouth 4 (four) times daily as needed (pain).   . hydroxychloroquine (PLAQUENIL) 200 MG tablet Take 1 tablet by mouth 2 (two) times daily. Reported on 03/12/2015  . PRESCRIPTION MEDICATION Patient is getting 2 cc of .05% Marcaine 1 cc 1000 mcg B12 By Dr. Berline Lopes once a month. Foot  Block   No facility-administered encounter medications on file as of 12/17/2015.      Objective: Blood pressure 122/70, pulse 68, temperature 97.7 F (36.5 C), temperature source Oral, resp. rate 18, height 5\' 7"  (1.702 m), weight 156 lb 12.8 oz (71.1 kg). Patient is alert and in no acute distress. Conjunctiva is pink. Sclera is nonicteric Oropharyngeal mucosa is normal. No neck masses or thyromegaly noted. Cardiac exam with regular rhythm normal S1 and S2. No murmur or gallop noted. Lungs are clear to auscultation. Abdomen is symmetrical soft and nontender without organomegaly or masses. No LE edema or clubbing noted.  Labs/studies Results: Abdominal pelvic CT from 02/03/2015 reviewed. Shows hepatic steatosis and cholelithiasis.   Assessment:  #1. GERD complicated by short segment Barrett's esophagus. She is doing well with therapy and I believe PPI dose could be reduced. #2. Irritable bowel syndrome. She appears to be doing well with when necessary dicyclomine which is desirable so that she will have less side effects. #3. Fatty liver noted on CT. She has had mildly elevated transaminases 2 years ago. However in December 2016 AST and ALT were 26 and 25 respectively. #4. Asymptomatic cholelithiasis. If she has symptoms will confirm diagnosis with ultrasound before recommending surgery.   Plan:  Decrease Nexium to 40 mg by mouth every morning Zantac OTC 150 mg of Pepcid OTC 20 mg by mouth daily at bedtime when necessary Patient will go to the lab for LFTs. Office visit in 6 months.

## 2015-12-17 NOTE — Patient Instructions (Signed)
Decrease Nexium to once daily before breakfast. Can take Zantac OTC 150 mg or Pepcid OTC 20 mg at bedtime for breakthrough heartburn. Physician will call with results of blood tests when completed.

## 2015-12-23 ENCOUNTER — Other Ambulatory Visit (INDEPENDENT_AMBULATORY_CARE_PROVIDER_SITE_OTHER): Payer: Self-pay | Admitting: *Deleted

## 2015-12-23 DIAGNOSIS — K760 Fatty (change of) liver, not elsewhere classified: Secondary | ICD-10-CM

## 2016-01-02 ENCOUNTER — Other Ambulatory Visit (INDEPENDENT_AMBULATORY_CARE_PROVIDER_SITE_OTHER): Payer: Self-pay | Admitting: Internal Medicine

## 2016-02-12 ENCOUNTER — Other Ambulatory Visit (INDEPENDENT_AMBULATORY_CARE_PROVIDER_SITE_OTHER): Payer: Self-pay | Admitting: *Deleted

## 2016-02-12 ENCOUNTER — Encounter (INDEPENDENT_AMBULATORY_CARE_PROVIDER_SITE_OTHER): Payer: Self-pay | Admitting: *Deleted

## 2016-02-12 DIAGNOSIS — K760 Fatty (change of) liver, not elsewhere classified: Secondary | ICD-10-CM

## 2016-03-12 ENCOUNTER — Encounter (INDEPENDENT_AMBULATORY_CARE_PROVIDER_SITE_OTHER): Payer: Medicare Other | Admitting: Ophthalmology

## 2016-03-12 DIAGNOSIS — H2512 Age-related nuclear cataract, left eye: Secondary | ICD-10-CM

## 2016-03-12 DIAGNOSIS — H33022 Retinal detachment with multiple breaks, left eye: Secondary | ICD-10-CM | POA: Diagnosis not present

## 2016-03-12 DIAGNOSIS — M069 Rheumatoid arthritis, unspecified: Secondary | ICD-10-CM

## 2016-03-12 DIAGNOSIS — H43813 Vitreous degeneration, bilateral: Secondary | ICD-10-CM

## 2016-03-17 ENCOUNTER — Encounter (INDEPENDENT_AMBULATORY_CARE_PROVIDER_SITE_OTHER): Payer: Self-pay | Admitting: *Deleted

## 2016-03-26 ENCOUNTER — Ambulatory Visit (INDEPENDENT_AMBULATORY_CARE_PROVIDER_SITE_OTHER): Payer: Medicare Other | Admitting: Ophthalmology

## 2016-03-26 DIAGNOSIS — H33022 Retinal detachment with multiple breaks, left eye: Secondary | ICD-10-CM

## 2016-04-09 LAB — HEPATIC FUNCTION PANEL
ALK PHOS: 118 U/L (ref 33–130)
ALT: 25 U/L (ref 6–29)
AST: 22 U/L (ref 10–35)
Albumin: 4.3 g/dL (ref 3.6–5.1)
BILIRUBIN DIRECT: 0.1 mg/dL (ref <=0.2)
BILIRUBIN INDIRECT: 0.3 mg/dL (ref 0.2–1.2)
Total Bilirubin: 0.4 mg/dL (ref 0.2–1.2)
Total Protein: 7.2 g/dL (ref 6.1–8.1)

## 2016-05-01 ENCOUNTER — Encounter (INDEPENDENT_AMBULATORY_CARE_PROVIDER_SITE_OTHER): Payer: Self-pay | Admitting: *Deleted

## 2016-05-01 ENCOUNTER — Telehealth (INDEPENDENT_AMBULATORY_CARE_PROVIDER_SITE_OTHER): Payer: Self-pay | Admitting: *Deleted

## 2016-05-01 NOTE — Telephone Encounter (Signed)
Patient needs trilyte 

## 2016-05-04 ENCOUNTER — Other Ambulatory Visit (INDEPENDENT_AMBULATORY_CARE_PROVIDER_SITE_OTHER): Payer: Self-pay | Admitting: *Deleted

## 2016-05-04 DIAGNOSIS — Z800 Family history of malignant neoplasm of digestive organs: Secondary | ICD-10-CM | POA: Insufficient documentation

## 2016-05-04 MED ORDER — PEG 3350-KCL-NA BICARB-NACL 420 G PO SOLR: 4000.0000 mL | Freq: Once | ORAL | 0 refills | Status: AC

## 2016-05-14 ENCOUNTER — Telehealth (INDEPENDENT_AMBULATORY_CARE_PROVIDER_SITE_OTHER): Payer: Self-pay | Admitting: *Deleted

## 2016-05-14 NOTE — Telephone Encounter (Signed)
Referring MD/PCP: knowlton   Procedure: tcs  Reason/Indication:  fam hx colon ca  Has patient had this procedure before?  Yes, 2013  If so, when, by whom and where?    Is there a family history of colon cancer?  Yes. Sister & brother  Who?  What age when diagnosed?    Is patient diabetic?   no      Does patient have prosthetic heart valve or mechanical valve?  no  Do you have a pacemaker?  no  Has patient ever had endocarditis? no  Has patient had joint replacement within last 12 months?  no  Does patient tend to be constipated or take laxatives? some  Does patient have a history of alcohol/drug use?  no  Is patient on Coumadin, Plavix and/or Aspirin? yes  Medications: BC powder, dicyclomine 10 mg prn, esomeprazole 40 mg bid, simvastatin 20 mg daily, hydroxychloroquine 200 mg, restasis   Allergies: nkda  Medication Adjustment per Dr Laural Golden: [facility] powder 2 days  Procedure date & time: 06/12/16 at 1205

## 2016-05-19 ENCOUNTER — Encounter (INDEPENDENT_AMBULATORY_CARE_PROVIDER_SITE_OTHER): Payer: Self-pay | Admitting: *Deleted

## 2016-05-19 NOTE — Telephone Encounter (Signed)
It's already noted in triage sheet and instruction sheet to stop it 2 days prior

## 2016-05-19 NOTE — Telephone Encounter (Signed)
If she takes the [facility] powder every day, she needs to stop it. Has ASA in it

## 2016-06-12 ENCOUNTER — Ambulatory Visit (HOSPITAL_COMMUNITY)
Admission: RE | Admit: 2016-06-12 | Discharge: 2016-06-12 | Disposition: A | Discharge status: Home / Self Care | Payer: Medicare Other | Source: Ambulatory Visit | Attending: Internal Medicine | Admitting: Internal Medicine

## 2016-06-12 ENCOUNTER — Encounter (HOSPITAL_COMMUNITY): Payer: Self-pay | Admitting: *Deleted

## 2016-06-12 ENCOUNTER — Encounter (HOSPITAL_COMMUNITY)
Admission: RE | Disposition: A | Discharge status: Home / Self Care | Payer: Self-pay | Source: Ambulatory Visit | Attending: Internal Medicine

## 2016-06-12 DIAGNOSIS — K5730 Diverticulosis of large intestine without perforation or abscess without bleeding: Secondary | ICD-10-CM | POA: Insufficient documentation

## 2016-06-12 DIAGNOSIS — K219 Gastro-esophageal reflux disease without esophagitis: Secondary | ICD-10-CM | POA: Diagnosis not present

## 2016-06-12 DIAGNOSIS — Z79899 Other long term (current) drug therapy: Secondary | ICD-10-CM | POA: Diagnosis not present

## 2016-06-12 DIAGNOSIS — M069 Rheumatoid arthritis, unspecified: Secondary | ICD-10-CM | POA: Insufficient documentation

## 2016-06-12 DIAGNOSIS — K6289 Other specified diseases of anus and rectum: Secondary | ICD-10-CM

## 2016-06-12 DIAGNOSIS — Z87891 Personal history of nicotine dependence: Secondary | ICD-10-CM | POA: Diagnosis not present

## 2016-06-12 DIAGNOSIS — Z800 Family history of malignant neoplasm of digestive organs: Secondary | ICD-10-CM

## 2016-06-12 DIAGNOSIS — Z1211 Encounter for screening for malignant neoplasm of colon: Secondary | ICD-10-CM | POA: Diagnosis present

## 2016-06-12 DIAGNOSIS — E7800 Pure hypercholesterolemia, unspecified: Secondary | ICD-10-CM | POA: Diagnosis not present

## 2016-06-12 DIAGNOSIS — Z7982 Long term (current) use of aspirin: Secondary | ICD-10-CM | POA: Insufficient documentation

## 2016-06-12 HISTORY — DX: Pure hypercholesterolemia, unspecified: E78.00

## 2016-06-12 HISTORY — DX: Cardiac murmur, unspecified: R01.1

## 2016-06-12 HISTORY — DX: Other seasonal allergic rhinitis: J30.2

## 2016-06-12 HISTORY — DX: Gastro-esophageal reflux disease without esophagitis: K21.9

## 2016-06-12 HISTORY — PX: COLONOSCOPY: SHX5424

## 2016-06-12 SURGERY — COLONOSCOPY
Anesthesia: Moderate Sedation

## 2016-06-12 MED ORDER — STERILE WATER FOR IRRIGATION IR SOLN
Status: DC | PRN
  Administered 2016-06-12: 2.5 mL

## 2016-06-12 MED ORDER — MEPERIDINE HCL 50 MG/ML IJ SOLN
INTRAMUSCULAR | Status: DC | PRN
  Administered 2016-06-12 (×2): 25 mg via INTRAVENOUS

## 2016-06-12 MED ORDER — MIDAZOLAM HCL 5 MG/5ML IJ SOLN
INTRAMUSCULAR | Status: AC
  Filled 2016-06-12: qty 10

## 2016-06-12 MED ORDER — SODIUM CHLORIDE 0.9 % IV SOLN
INTRAVENOUS | Status: DC
  Administered 2016-06-12: 10:00:00 via INTRAVENOUS

## 2016-06-12 MED ORDER — MIDAZOLAM HCL 5 MG/5ML IJ SOLN
INTRAMUSCULAR | Status: DC | PRN
  Administered 2016-06-12 (×2): 2 mg via INTRAVENOUS
  Administered 2016-06-12 (×2): 1 mg via INTRAVENOUS
  Administered 2016-06-12: 2 mg via INTRAVENOUS

## 2016-06-12 MED ORDER — MEPERIDINE HCL 50 MG/ML IJ SOLN
INTRAMUSCULAR | Status: AC
  Filled 2016-06-12: qty 1

## 2016-06-12 NOTE — Discharge Instructions (Signed)
Resume usual medications and high fiber diet. No driving for 24 hours. Next colonoscopy in 5 years.     Colonoscopy, Adult, Care After This sheet gives you information about how to care for yourself after your procedure. Your doctor may also give you more specific instructions. If you have problems or questions, call your doctor. Follow these instructions at home: General instructions    For the first 24 hours after the procedure:  Do not drive or use machinery.  Do not sign important documents.  Do not drink alcohol.  Do your daily activities more slowly than normal.  Eat foods that are soft and easy to digest.  Rest often.  Take over-the-counter or prescription medicines only as told by your doctor.  It is up to you to get the results of your procedure. Ask your doctor, or the department performing the procedure, when your results will be ready. To help cramping and bloating:   Try walking around.  Put heat on your belly (abdomen) as told by your doctor. Use a heat source that your doctor recommends, such as a moist heat pack or a heating pad.  Put a towel between your skin and the heat source.  Leave the heat on for 20-30 minutes.  Remove the heat if your skin turns bright red. This is especially important if you cannot feel pain, heat, or cold. You can get burned. Eating and drinking   Drink enough fluid to keep your pee (urine) clear or pale yellow.  Return to your normal diet as told by your doctor. Avoid heavy or fried foods that are hard to digest.  Avoid drinking alcohol for as long as told by your doctor. Contact a doctor if:  You have blood in your poop (stool) 2-3 days after the procedure. Get help right away if:  You have more than a small amount of blood in your poop.  You see large clumps of tissue (blood clots) in your poop.  Your belly is swollen.  You feel sick to your stomach (nauseous).  You throw up (vomit).  You have a fever.  You  have belly pain that gets worse, and medicine does not help your pain. This information is not intended to replace advice given to you by your health care provider. Make sure you discuss any questions you have with your health care provider. Document Released: 03/07/2010 Document Revised: 10/28/2015 Document Reviewed: 10/28/2015 Elsevier Interactive Patient Education  2017 [employer] Inc[employer]    Diverticulosis Diverticulosis is a condition that develops when small pouches (diverticula) form in the wall of the large intestine (colon). The colon is where water is absorbed and stool is formed. The pouches form when the inside layer of the colon pushes through weak spots in the outer layers of the colon. You may have a few pouches or many of them. What are the causes? The cause of this condition is not known. What increases the risk? The following factors may make you more likely to develop this condition:  Being older than age 49. Your risk for this condition increases with age. Diverticulosis is rare among people younger than age 69. By age 10, many people have it.  Eating a low-fiber diet.  Having frequent constipation.  Being overweight.  Not getting enough exercise.  Smoking.  Taking over-the-counter pain medicines, like aspirin and ibuprofen.  Having a family history of diverticulosis. What are the signs or symptoms? In most people, there are no symptoms of this condition. If you do have  symptoms, they may include:  Bloating.  Cramps in the abdomen.  Constipation or diarrhea.  Pain in the lower left side of the abdomen. How is this diagnosed? This condition is most often diagnosed during an exam for other colon problems. Because diverticulosis usually has no symptoms, it often cannot be diagnosed independently. This condition may be diagnosed by:  Using a flexible scope to examine the colon (colonoscopy).  Taking an X-ray of the colon after dye has been put into the colon  (barium enema).  Doing a CT scan. How is this treated? You may not need treatment for this condition if you have never developed an infection related to diverticulosis. If you have had an infection before, treatment may include:  Eating a high-fiber diet. This may include eating more fruits, vegetables, and grains.  Taking a fiber supplement.  Taking a live bacteria supplement (probiotic).  Taking medicine to relax your colon.  Taking antibiotic medicines. Follow these instructions at home:  Drink 6-8 glasses of water or more each day to prevent constipation.  Try not to strain when you have a bowel movement.  If you have had an infection before:  Eat more fiber as directed by your health care provider or your diet and nutrition specialist (dietitian).  Take a fiber supplement or probiotic, if your health care provider approves.  Take over-the-counter and prescription medicines only as told by your health care provider.  If you were prescribed an antibiotic, take it as told by your health care provider. Do not stop taking the antibiotic even if you start to feel better.  Keep all follow-up visits as told by your health care provider. This is important. Contact a health care provider if:  You have pain in your abdomen.  You have bloating.  You have cramps.  You have not had a bowel movement in 3 days. Get help right away if:  Your pain gets worse.  Your bloating becomes very bad.  You have a fever or chills, and your symptoms suddenly get worse.  You vomit.  You have bowel movements that are bloody or black.  You have bleeding from your rectum. Summary  Diverticulosis is a condition that develops when small pouches (diverticula) form in the wall of the large intestine (colon).  You may have a few pouches or many of them.  This condition is most often diagnosed during an exam for other colon problems.  If you have had an infection related to diverticulosis,  treatment may include increasing the fiber in your diet, taking supplements, or taking medicines. This information is not intended to replace advice given to you by your health care provider. Make sure you discuss any questions you have with your health care provider. Document Released: 10/31/2003 Document Revised: 12/23/2015 Document Reviewed: 12/23/2015 Elsevier Interactive Patient Education  2017 [employer].    High-Fiber Diet Fiber, also called dietary fiber, is a type of carbohydrate found in fruits, vegetables, whole grains, and beans. A high-fiber diet can have many health benefits. Your health care provider may recommend a high-fiber diet to help:  Prevent constipation. Fiber can make your bowel movements more regular.  Lower your cholesterol.  Relieve hemorrhoids, uncomplicated diverticulosis, or irritable bowel syndrome.  Prevent overeating as part of a weight-loss plan.  Prevent heart disease, type 2 diabetes, and certain cancers. What is my plan? The recommended daily intake of fiber includes:  38 grams for men under age 30.  24 grams for men over age 64.  83  grams for women under age 40.  22 grams for women over age 37. You can get the recommended daily intake of dietary fiber by eating a variety of fruits, vegetables, grains, and beans. Your health care provider may also recommend a fiber supplement if it is not possible to get enough fiber through your diet. What do I need to know about a high-fiber diet?  Fiber supplements have not been widely studied for their effectiveness, so it is better to get fiber through food sources.  Always check the fiber content on thenutrition facts label of any prepackaged food. Look for foods that contain at least 5 grams of fiber per serving.  Ask your dietitian if you have questions about specific foods that are related to your condition, especially if those foods are not listed in the following section.  Increase your daily  fiber consumption gradually. Increasing your intake of dietary fiber too quickly may cause bloating, cramping, or gas.  Drink plenty of water. Water helps you to digest fiber. What foods can I eat? Grains  Whole-grain breads. Multigrain cereal. Oats and oatmeal. Brown rice. Barley. Bulgur wheat. [place]. Bran muffins. Popcorn. Rye wafer crackers. Vegetables  Sweet potatoes. Spinach. Kale. Artichokes. Cabbage. Broccoli. Green peas. Carrots. Squash. Fruits  Berries. Pears. Apples. Oranges. Avocados. Prunes and raisins. Dried figs. Meats and Other Protein Sources  [place], kidney, pinto, and soy beans. Split peas. Lentils. Nuts and seeds. Dairy  Fiber-fortified yogurt. Beverages  Fiber-fortified soy milk. Fiber-fortified orange juice. Other  Fiber bars. The items listed above may not be a complete list of recommended foods or beverages. Contact your dietitian for more options.  What foods are not recommended? Grains  White bread. Pasta made with refined flour. White rice. Vegetables  Fried potatoes. Canned vegetables. Well-cooked vegetables. Fruits  Fruit juice. Cooked, strained fruit. Meats and Other Protein Sources  Fatty cuts of meat. Fried Sales executive or fried fish. Dairy  Milk. Yogurt. Cream cheese. Sour cream. Beverages  Soft drinks. Other  Cakes and pastries. Butter and oils. The items listed above may not be a complete list of foods and beverages to avoid. Contact your dietitian for more information.  What are some tips for including high-fiber foods in my diet?  Eat a wide variety of high-fiber foods.  Make sure that half of all grains consumed each day are whole grains.  Replace breads and cereals made from refined flour or white flour with whole-grain breads and cereals.  Replace white rice with brown rice, bulgur wheat, or millet.  Start the day with a breakfast that is high in fiber, such as a cereal that contains at least 5 grams of fiber per serving.  Use beans in  place of meat in soups, salads, or pasta.  Eat high-fiber snacks, such as berries, raw vegetables, nuts, or popcorn. This information is not intended to replace advice given to you by your health care provider. Make sure you discuss any questions you have with your health care provider. Document Released: 02/02/2005 Document Revised: 07/11/2015 Document Reviewed: 07/18/2013 Elsevier Interactive Patient Education  2017 [employer].

## 2016-06-12 NOTE — H&P (Addendum)
Mackenzie Black is an 62 y.o. female.   Chief Complaint: Patient is here for colonoscopy. HPI: Patient is 61 year old Caucasian female who is here for screening colonoscopy. Her last exam was 5 years ago. She denies abdominal pain change in bowel habits or rectal bleeding. Her sister had surgery for colon carcinoma at age 22. Another sister has had multiple adenomas(she had colonoscopy 2 days ago and had 13 tubular adenomas removed).  Past Medical History:  Diagnosis Date  . Back pain   . GERD (gastroesophageal reflux disease)       . Hypercholesteremia   . Neuropathy   . RA (rheumatoid arthritis) ([place])   . Seasonal allergies     Past Surgical History:  Procedure Laterality Date  . Bilateral foot surgery    . COLONOSCOPY  03/31/2011   Procedure: COLONOSCOPY;  Surgeon: Jamesetta So, MD;  Location: [facility];  Service: Gastroenterology;  Laterality: N/A;  . ESOPHAGEAL DILATION N/A 07/12/2015   Procedure: ESOPHAGEAL DILATION;  Surgeon: Rogene Houston, MD;  Location: [facility];  Service: Endoscopy;  Laterality: N/A;  . ESOPHAGOGASTRODUODENOSCOPY N/A 07/12/2015   Procedure: ESOPHAGOGASTRODUODENOSCOPY (EGD);  Surgeon: Rogene Houston, MD;  Location: [facility];  Service: Endoscopy;  Laterality: N/A;  250-moved to 1245 Ann to notify pt  . excision of cyst of hand    . KNEE SURGERY    . TONSILLECTOMY    . TUBAL LIGATION      Family History  Problem Relation Age of Onset  . Colon cancer Sister   . Colon cancer Brother   . Stroke Mother   . Cancer - Lung Father    Social History:  reports that she has quit smoking. Her smoking use included Cigarettes. She has a 0.50 pack-year smoking history. She has never used smokeless tobacco. She reports that she does not drink alcohol or use drugs.  Allergies:  Allergies  Allergen Reactions  . Humira [Adalimumab]     rash    Medications Prior to Admission  Medication Sig Dispense Refill  . dicyclomine (BENTYL) 10 MG capsule  TAKE (1) CAPSULE THREE TIMES DAILY AS NEEDED. 60 capsule 3  . esomeprazole (NEXIUM) 40 MG capsule TAKE 1 CAPSULE TWICE DAILY BEFORE MEALS 60 capsule 5  . gabapentin (NEURONTIN) 300 MG capsule Take 300 mg by mouth 4 (four) times daily as needed (pain).     . Aspirin-Caffeine (BC FAST PAIN RELIEF) 845-65 MG PACK Take 65 mg by mouth. Patient states that she takes a BC 1 to 2 times a week for headache 56 each   . atorvastatin (LIPITOR) 20 MG tablet Take 20 mg by mouth daily.     . fluticasone (FLONASE) 50 MCG/ACT nasal spray Place 2 sprays into both nostrils daily as needed for allergies.     . hydroxychloroquine (PLAQUENIL) 200 MG tablet Take 1 tablet by mouth 2 (two) times daily. Reported on 03/12/2015    . PRESCRIPTION MEDICATION Patient is getting 2 cc of .05% Marcaine 1 cc 1000 mcg B12 By Dr. Berline Lopes once a month. Foot Block      No results found for this or any previous visit (from the past 48 hour(s)). No results found.  ROS  Blood pressure (!) 127/102, pulse 74, temperature 98 F (36.7 C), temperature source Oral, resp. rate 13, height 5\' 5"  (1.651 m), weight 167 lb (75.8 kg), SpO2 100 %. Physical Exam  Constitutional: She appears well-developed and well-nourished.  HENT:  Mouth/Throat: Oropharynx is clear and moist.  Eyes: Conjunctivae are normal. No scleral icterus.  Neck: No thyromegaly present.  Cardiovascular: Normal rate, regular rhythm and normal heart sounds.   Respiratory: Effort normal and breath sounds normal.  GI: Soft. She exhibits no distension and no mass. There is no tenderness.  Musculoskeletal: She exhibits no edema.  Lymphadenopathy:    She has no cervical adenopathy.  Neurological: She is alert.  Skin: Skin is warm and dry.     Assessment/Plan High risk screening colonoscopy. Family history of CRC in a sibling and another sibling with multiple adenomas.  Hildred Laser, MD 06/12/2016, 10:40 AM

## 2016-06-12 NOTE — Op Note (Signed)
[facility] Patient Name: Mackenzie Black Procedure Date: 06/12/2016 10:16 AM MRN: [id number] Date of Birth: 1955/09/22 Attending MD: Hildred Laser , MD CSN: [id number] Age: 61 Admit Type: Outpatient Procedure:                Colonoscopy Indications:              Screening in patient at increased risk: Colorectal                            cancer in sister 40 or older Providers:                Hildred Laser, MD, Charlyne Petrin RN, RN, Aram Candela Referring MD:             Lemmie Evens, MD Medicines:                Meperidine 50 mg IV, Midazolam 7 mg IV Complications:            No immediate complications. Estimated Blood Loss:     Estimated blood loss: none. Procedure:                Pre-Anesthesia Assessment:                           - Prior to the procedure, a History and Physical                            was performed, and patient medications and                            allergies were reviewed. The patient's tolerance of                            previous anesthesia was also reviewed. The risks                            and benefits of the procedure and the sedation                            options and risks were discussed with the patient.                            All questions were answered, and informed consent                            was obtained. Prior Anticoagulants: The patient                            last took aspirin 3 days prior to the procedure.                            ASA Grade Assessment: III - A patient with severe  systemic disease. After reviewing the risks and                            benefits, the patient was deemed in satisfactory                            condition to undergo the procedure.                           After obtaining informed consent, the colonoscope                            was passed under direct vision. Throughout the                            procedure,  the patient's blood pressure, pulse, and                            oxygen saturations were monitored continuously. The                            EC-[id number] ([id number]) scope was introduced through                            the anus and advanced to the the cecum, identified                            by appendiceal orifice and ileocecal valve. The                            colonoscopy was performed without difficulty. The                            patient tolerated the procedure well. The quality                            of the bowel preparation was good. The appendiceal                            orifice and the rectum were photographed. Scope In: 10:51:12 AM Scope Out: 11:09:20 AM Scope Withdrawal Time: 0 hours 6 minutes 10 seconds  Total Procedure Duration: 0 hours 18 minutes 8 seconds  Findings:      The perianal and digital rectal examinations were normal.      A few small-mouthed diverticula were found in the sigmoid colon.      Anal papilla(e) were hypertrophied. Impression:               - Diverticulosis in the sigmoid colon.                           - Anal papilla(e) were hypertrophied.                           - No specimens collected. Moderate Sedation:  Moderate (conscious) sedation was administered by the endoscopy nurse       and supervised by the endoscopist. The following parameters were       monitored: oxygen saturation, heart rate, blood pressure, CO2       capnography and response to care. Total physician intraservice time was       24 minutes. Recommendation:           - Patient has a contact number available for                            emergencies. The signs and symptoms of potential                            delayed complications were discussed with the                            patient. Return to normal activities tomorrow.                            Written discharge instructions were provided to the                            patient.                            - High fiber diet today.                           - Continue present medications.                           - Repeat colonoscopy in 5 years for screening                            purposes. Procedure Code(s):        --- Professional ---                           [id number], Colonoscopy, flexible; diagnostic, including                            collection of specimen(s) by brushing or washing,                            when performed (separate procedure)                           99152, Moderate sedation services provided by the                            same physician or other qualified health care                            professional performing the diagnostic or  therapeutic service that the sedation supports,                            requiring the presence of an independent trained                            observer to assist in the monitoring of the                            patient's level of consciousness and physiological                            status; initial 15 minutes of intraservice time,                            patient age 36 years or older                           [phone], Moderate sedation services; each additional                            15 minutes intraservice time Diagnosis Code(s):        --- Professional ---                           Z80.0, Family history of malignant neoplasm of                            digestive organs                           K62.89, Other specified diseases of anus and rectum                           K57.30, Diverticulosis of large intestine without                            perforation or abscess without bleeding CPT copyright 2016 American Medical Association. All rights reserved. The codes documented in this report are preliminary and upon coder review may  be revised to meet current compliance requirements. Hildred Laser, MD Hildred Laser, MD 06/12/2016 11:18:24 AM This report has  been signed electronically. Number of Addenda: 0

## 2016-06-16 ENCOUNTER — Ambulatory Visit (INDEPENDENT_AMBULATORY_CARE_PROVIDER_SITE_OTHER): Payer: Medicare Other | Admitting: Internal Medicine

## 2016-06-16 ENCOUNTER — Encounter (HOSPITAL_COMMUNITY): Payer: Self-pay | Admitting: Internal Medicine

## 2016-06-16 VITALS — BP 120/82 | HR 76 | Temp 97.7°F | Resp 18 | Ht 67.0 in | Wt 164.2 lb

## 2016-06-16 DIAGNOSIS — K219 Gastro-esophageal reflux disease without esophagitis: Secondary | ICD-10-CM | POA: Diagnosis not present

## 2016-06-16 DIAGNOSIS — K2270 Barrett's esophagus without dysplasia: Secondary | ICD-10-CM | POA: Diagnosis not present

## 2016-06-16 NOTE — Progress Notes (Signed)
Presenting complaint;  Follow-up for GERD.  Database and Subjective:  Patient is 61 year old Caucasian female was chronic GERD complicated by short segment Barrett's esophagus whose last EGD was in 07/12/2015 who is here for scheduled visit. She says heartburn is well controlled with therapy. She denies throat symptoms or dysphagia. She is not having any side effects with PPI. She still having intermittent abdominal cramps and urgency reliever dicyclomine which she takes no more than 4-5 times in a month. He has history of headache and uses BC powder at least 2-3 times a week. She has good appetite and a weight has been stable.   Current Medications: Outpatient Encounter Prescriptions as of 06/16/2016  Medication Sig  . Aspirin-Caffeine (BC FAST PAIN RELIEF) 845-65 MG PACK Take 65 mg by mouth. Patient states that she takes a BC 1 to 2 times a week for headache  . atorvastatin (LIPITOR) 20 MG tablet Take 20 mg by mouth daily.   Marland Kitchen dicyclomine (BENTYL) 10 MG capsule TAKE (1) CAPSULE THREE TIMES DAILY AS NEEDED.  Marland Kitchen esomeprazole (NEXIUM) 40 MG capsule TAKE 1 CAPSULE TWICE DAILY BEFORE MEALS  . gabapentin (NEURONTIN) 300 MG capsule Take 300 mg by mouth 4 (four) times daily as needed (pain).   . hydroxychloroquine (PLAQUENIL) 200 MG tablet Take 1 tablet by mouth 2 (two) times daily. Reported on 03/12/2015  . PRESCRIPTION MEDICATION Patient is getting 2 cc of .05% Marcaine 1 cc 1000 mcg B12 By Dr. Berline Lopes once a month. Foot Block  . RESTASIS MULTIDOSE 0.05 % ophthalmic emulsion Place 1 drop into both eyes 2 (two) times daily.   . [DISCONTINUED] fluticasone (FLONASE) 50 MCG/ACT nasal spray Place 2 sprays into both nostrils daily as needed for allergies.    No facility-administered encounter medications on file as of 06/16/2016.      Objective: Blood pressure 120/82, pulse 76, temperature 97.7 F (36.5 C), temperature source Oral, resp. rate 18, height 5\' 7"  (1.702 m), weight 164 lb 3.2 oz (74.5  kg). Patient is alert and in no acute distress. Conjunctiva is pink. Sclera is nonicteric Oropharyngeal mucosa is normal. No neck masses or thyromegaly noted. Cardiac exam with regular rhythm normal S1 and S2. No murmur or gallop noted. Lungs are clear to auscultation. Abdomen is symmetrical soft and nontender without organomegaly or masses. No LE edema or clubbing noted.   Assessment:  #1. Chronic GERD complicated by short segment Barrett's esophagus. Symptoms well controlled with therapy. Given underlying connective tissue disorder she possibly also has esophageal motility disorder and will need to be on chronic PPI therapy. #2. IBS. She is doing well and using dicyclomine a few times a month. #3. She shouldn't is high risk for CRC given history of colon cancer in brother and a sister. Another sister had colonoscopy last week with removal of 13 adenomas but she did not have any on exam last week.   Plan:  Patient advised to keep BC use to minimum. She will continue Nexium at 40 mg by mouth every morning. Next colonoscopy in April 2023. Office visit in one year.

## 2016-06-16 NOTE — Patient Instructions (Signed)
Keep BC use to minimum

## 2016-06-17 MED ORDER — ESOMEPRAZOLE MAGNESIUM 40 MG PO CPDR: DELAYED_RELEASE_CAPSULE | ORAL | 5 refills | Status: DC

## 2016-07-09 ENCOUNTER — Encounter (INDEPENDENT_AMBULATORY_CARE_PROVIDER_SITE_OTHER): Payer: Self-pay

## 2016-07-29 ENCOUNTER — Ambulatory Visit (INDEPENDENT_AMBULATORY_CARE_PROVIDER_SITE_OTHER): Payer: Medicare Other | Admitting: Ophthalmology

## 2016-07-29 DIAGNOSIS — H43813 Vitreous degeneration, bilateral: Secondary | ICD-10-CM | POA: Diagnosis not present

## 2016-07-29 DIAGNOSIS — H2513 Age-related nuclear cataract, bilateral: Secondary | ICD-10-CM

## 2016-07-29 DIAGNOSIS — H33022 Retinal detachment with multiple breaks, left eye: Secondary | ICD-10-CM

## 2016-10-02 ENCOUNTER — Other Ambulatory Visit (HOSPITAL_COMMUNITY): Payer: Self-pay | Admitting: Family Medicine

## 2016-10-02 DIAGNOSIS — Z1231 Encounter for screening mammogram for malignant neoplasm of breast: Secondary | ICD-10-CM

## 2016-11-06 ENCOUNTER — Ambulatory Visit (HOSPITAL_COMMUNITY): Payer: Medicare Other

## 2016-11-12 ENCOUNTER — Ambulatory Visit (HOSPITAL_COMMUNITY)
Admission: RE | Admit: 2016-11-12 | Discharge: 2016-11-12 | Disposition: A | Discharge status: Home / Self Care | Payer: Medicare Other | Source: Ambulatory Visit | Attending: Family Medicine | Admitting: Family Medicine

## 2016-11-12 DIAGNOSIS — Z1231 Encounter for screening mammogram for malignant neoplasm of breast: Secondary | ICD-10-CM | POA: Diagnosis present

## 2016-12-30 ENCOUNTER — Other Ambulatory Visit (INDEPENDENT_AMBULATORY_CARE_PROVIDER_SITE_OTHER): Payer: Self-pay | Admitting: Internal Medicine

## 2017-05-20 ENCOUNTER — Encounter: Payer: Self-pay | Admitting: Cardiology

## 2017-05-20 ENCOUNTER — Ambulatory Visit (INDEPENDENT_AMBULATORY_CARE_PROVIDER_SITE_OTHER): Payer: Medicare Other | Admitting: Cardiology

## 2017-05-20 VITALS — BP 130/80 | HR 75 | Ht 66.0 in | Wt 169.0 lb

## 2017-05-20 DIAGNOSIS — R002 Palpitations: Secondary | ICD-10-CM | POA: Diagnosis not present

## 2017-05-20 DIAGNOSIS — R011 Cardiac murmur, unspecified: Secondary | ICD-10-CM | POA: Diagnosis not present

## 2017-05-20 NOTE — Progress Notes (Signed)
Clinical Summary Mackenzie Black is a 62 y.o.female seen as new consult, referred by Dr Karie Kirks for palpitations.   1. Palpitations - off and on several years - feelilng of heating jumping. More often occurs with laying down or at rest -several episodes during a week. Variable in duration. No other associated symptoms.  - no EtoH. No coffee. Occasoinal tea. Drinks pretty frequent sodas, trying to change to caffeine free. Drinks hot [employer]. Takes bc powders that have caffeine.  - she reports a strong family history of afib.   2. Numbness left arm - left arm numbnes, shoulder down. Can feel like arm is asleep.  - sporadic Past Medical History:  Diagnosis Date  . Back pain   . GERD (gastroesophageal reflux disease)   . Heart murmur   . Hypercholesteremia   . Neuropathy   . RA (rheumatoid arthritis) ([place])   . Seasonal allergies      Allergies  Allergen Reactions  . Humira [Adalimumab]     rash     Current Outpatient Medications  Medication Sig Dispense Refill  . Aspirin-Caffeine (BC FAST PAIN RELIEF) 845-65 MG PACK Take 65 mg by mouth. Patient states that she takes a BC 1 to 2 times a week for headache 56 each   . atorvastatin (LIPITOR) 20 MG tablet Take 20 mg by mouth daily.     Marland Kitchen dicyclomine (BENTYL) 10 MG capsule TAKE (1) CAPSULE THREE TIMES DAILY AS NEEDED. 60 capsule 3  . esomeprazole (NEXIUM) 40 MG capsule TAKE 1 CAPSULE DAILY BEFORE BREAKFAST. 30 capsule 5  . gabapentin (NEURONTIN) 300 MG capsule Take 300 mg by mouth 4 (four) times daily as needed (pain).     . hydroxychloroquine (PLAQUENIL) 200 MG tablet Take 1 tablet by mouth 2 (two) times daily. Reported on 03/12/2015    . PRESCRIPTION MEDICATION Patient is getting 2 cc of .05% Marcaine 1 cc 1000 mcg B12 By Dr. Berline Lopes once a month. Foot Block    . RESTASIS MULTIDOSE 0.05 % ophthalmic emulsion Place 1 drop into both eyes 2 (two) times daily.      No current facility-administered medications for this  visit.      Past Surgical History:  Procedure Laterality Date  . Bilateral foot surgery    . COLONOSCOPY  03/31/2011   Procedure: COLONOSCOPY;  Surgeon: Jamesetta So, MD;  [facility];  Service: Gastroenterology;  Laterality: N/A;  . COLONOSCOPY N/A 06/12/2016   Procedure: COLONOSCOPY;  Surgeon: Rogene Houston, MD;  [facility];  Service: Endoscopy;  Laterality: N/A;  1205-moved to 1035  . ESOPHAGEAL DILATION N/A 07/12/2015   Procedure: ESOPHAGEAL DILATION;  Surgeon: Rogene Houston, MD;  [facility];  Service: Endoscopy;  Laterality: N/A;  . ESOPHAGOGASTRODUODENOSCOPY N/A 07/12/2015   Procedure: ESOPHAGOGASTRODUODENOSCOPY (EGD);  Surgeon: Rogene Houston, MD;  [facility];  Service: Endoscopy;  Laterality: N/A;  250-moved to 1245 Ann to notify pt  . excision of cyst of hand    . KNEE SURGERY    . TONSILLECTOMY    . TUBAL LIGATION       Allergies  Allergen Reactions  . Humira [Adalimumab]     rash      Family History  Problem Relation Age of Onset  . Colon cancer Sister   . Colon cancer Brother   . Stroke Mother   . Cancer - Lung Father      Social History Mackenzie Black reports that  she has quit smoking. Her smoking use included cigarettes. She has a 0.50 pack-year smoking history. She has never used smokeless tobacco. Mackenzie Black reports that she does not drink alcohol.   Review of Systems CONSTITUTIONAL: No weight loss, fever, chills, weakness or fatigue.  HEENT: Eyes: No visual loss, blurred vision, double vision or yellow sclerae.No hearing loss, sneezing, congestion, runny nose or sore throat.  SKIN: No rash or itching.  CARDIOVASCULAR: per hpi RESPIRATORY: No shortness of breath, cough or sputum.  GASTROINTESTINAL: No anorexia, nausea, vomiting or diarrhea. No abdominal pain or blood.  GENITOURINARY: No burning on urination, no polyuria NEUROLOGICAL: No headache, dizziness, syncope, paralysis, ataxia, numbness or  tingling in the extremities. No change in bowel or bladder control.  MUSCULOSKELETAL: No muscle, back pain, joint pain or stiffness.  LYMPHATICS: No enlarged nodes. No history of splenectomy.  PSYCHIATRIC: No history of depression or anxiety.  ENDOCRINOLOGIC: No reports of sweating, cold or heat intolerance. No polyuria or polydipsia.  Marland Kitchen   Physical Examination Vitals:   05/20/17 1337  BP: 130/80  Pulse: 75  SpO2: 97%   Vitals:   05/20/17 1337  Weight: 169 lb (76.7 kg)  Height: 5\' 6"  (1.676 m)    Gen: resting comfortably, no acute distress HEENT: no scleral icterus, pupils equal round and reactive, no palptable cervical adenopathy,  CV: RRR, 2/6 systolic murmur at apex, no jvd Resp: Clear to auscultation bilaterally GI: abdomen is soft, non-tender, non-distended, normal bowel sounds, no hepatosplenomegaly MSK: extremities are warm, no edema.  Skin: warm, no rash Neuro:  no focal deficits Psych: appropriate affect     Assessment and Plan  1. Palpitations - EKG in clinic today shows normal sinus rhythm - we will obtain an event monitor to further evaluate  2. Heart murmur - we will obtain an echo for further evaluation.       Arnoldo Lenis, M.D.

## 2017-05-20 NOTE — Patient Instructions (Signed)
Medication Instructions:  Your physician recommends that you continue on your current medications as directed. Please refer to the Current Medication list given to you today.   Labwork: none  Testing/Procedures: Your physician has requested that you have an echocardiogram. Echocardiography is a painless test that uses sound waves to create images of your heart. It provides your doctor with information about the size and shape of your heart and how well your heart's chambers and valves are working. This procedure takes approximately one hour. There are no restrictions for this procedure.  Your physician has recommended that you wear an event monitor. Event monitors are medical devices that record the heart's electrical activity. Doctors most often [place] these monitors to diagnose arrhythmias. Arrhythmias are problems with the speed or rhythm of the heartbeat. The monitor is a small, portable device. You can wear one while you do your normal daily activities. This is usually used to diagnose what is causing palpitations/syncope (passing out).    Follow-Up: Your physician recommends that you schedule a follow-up appointment in: pending test results    Any Other Special Instructions Will Be Listed Below (If Applicable).     If you need a refill on your cardiac medications before your next appointment, please call your pharmacy.

## 2017-05-25 ENCOUNTER — Ambulatory Visit (HOSPITAL_COMMUNITY)
Admission: RE | Admit: 2017-05-25 | Discharge: 2017-05-25 | Disposition: A | Discharge status: Home / Self Care | Payer: Medicare Other | Source: Ambulatory Visit | Attending: Cardiology | Admitting: Cardiology

## 2017-05-25 DIAGNOSIS — I471 Supraventricular tachycardia: Secondary | ICD-10-CM | POA: Insufficient documentation

## 2017-05-25 DIAGNOSIS — I10 Essential (primary) hypertension: Secondary | ICD-10-CM | POA: Diagnosis not present

## 2017-05-25 DIAGNOSIS — Z87891 Personal history of nicotine dependence: Secondary | ICD-10-CM | POA: Diagnosis not present

## 2017-05-25 DIAGNOSIS — R011 Cardiac murmur, unspecified: Secondary | ICD-10-CM | POA: Insufficient documentation

## 2017-05-25 NOTE — Progress Notes (Signed)
*  PRELIMINARY RESULTS* Echocardiogram 2D Echocardiogram has been performed.  Leavy Cella 05/25/2017, 2:04 PM

## 2017-05-26 ENCOUNTER — Encounter: Payer: Self-pay | Admitting: Cardiology

## 2017-05-28 ENCOUNTER — Telehealth: Payer: Self-pay

## 2017-05-28 NOTE — Telephone Encounter (Signed)
Spoke with pt concerning her event monitor. She stated that she had not received it. I called [employer] and they stated that it was on the auto ship as of 4/10 and should arrive to patient by Monday. I advised her to call back Monday afternoon if she had not gotten it. She voiced understanding.

## 2017-06-09 ENCOUNTER — Telehealth: Payer: Self-pay | Admitting: Cardiology

## 2017-06-09 ENCOUNTER — Ambulatory Visit: Payer: Medicare Other

## 2017-06-09 ENCOUNTER — Encounter (INDEPENDENT_AMBULATORY_CARE_PROVIDER_SITE_OTHER): Payer: Medicare Other

## 2017-06-09 DIAGNOSIS — R002 Palpitations: Secondary | ICD-10-CM

## 2017-06-09 NOTE — Telephone Encounter (Signed)
Wants to know if she can come at 8 tomorrow morning to have monitor put on, if she can't then she will keep apt for today.

## 2017-06-09 NOTE — Telephone Encounter (Signed)
Spoke with pt who will come today at 4

## 2017-06-22 ENCOUNTER — Encounter (INDEPENDENT_AMBULATORY_CARE_PROVIDER_SITE_OTHER): Payer: Self-pay | Admitting: Internal Medicine

## 2017-06-22 ENCOUNTER — Ambulatory Visit (INDEPENDENT_AMBULATORY_CARE_PROVIDER_SITE_OTHER): Payer: Medicare Other | Admitting: Internal Medicine

## 2017-06-22 VITALS — BP 130/100 | HR 78 | Temp 97.5°F | Resp 18 | Ht 67.0 in | Wt 168.8 lb

## 2017-06-22 DIAGNOSIS — R1310 Dysphagia, unspecified: Secondary | ICD-10-CM

## 2017-06-22 DIAGNOSIS — R1319 Other dysphagia: Secondary | ICD-10-CM

## 2017-06-22 DIAGNOSIS — K219 Gastro-esophageal reflux disease without esophagitis: Secondary | ICD-10-CM

## 2017-06-22 DIAGNOSIS — K2270 Barrett's esophagus without dysplasia: Secondary | ICD-10-CM

## 2017-06-22 MED ORDER — ESOMEPRAZOLE MAGNESIUM 40 MG PO CPDR: DELAYED_RELEASE_CAPSULE | ORAL | 11 refills | Status: DC

## 2017-06-22 NOTE — Progress Notes (Signed)
Presenting complaint;  Follow-up for GERD.  Database and subjective:  Patient is 62 year old Caucasian female who has chronic GERD complicated by short segment Barrett's esophagus who is here for scheduled visit.  She was last seen 1 year ago.  Last EGD with ED was 2 years ago.  She was also found to have Schatzki's ring and esophagus was dilated.  She says heartburn is well controlled he may have 1 or 2 episodes.  It is mild and she does not need antacids or other treatment.  She continues to complain of dysphagia which occurs with certain foods but she has not had an episode of food impaction.  She does not feel it is bad enough for her to go under esophageal dilation.  She has good appetite.  Her weight has been stable.  Her bowels move daily for a few days and then she may skip.  However she denies melena or rectal bleeding.  She has taken dicyclomine on few occasions for left-sided abdominal pain and it helps.   Current Medications: Outpatient Encounter Medications as of 06/22/2017  Medication Sig  . Aspirin-Caffeine (BC FAST PAIN RELIEF) 845-65 MG PACK Take 65 mg by mouth. Patient states that she takes a BC 1 to 2 times a week for headache  . atorvastatin (LIPITOR) 10 MG tablet Take 10 mg by mouth daily at 6 PM.   . Cyanocobalamin (B-12) 1000 MCG/ML KIT Inject as directed every 30 (thirty) days.  Marland Kitchen dicyclomine (BENTYL) 10 MG capsule TAKE (1) CAPSULE THREE TIMES DAILY AS NEEDED.  Marland Kitchen esomeprazole (NEXIUM) 40 MG capsule TAKE 1 CAPSULE DAILY BEFORE BREAKFAST.  Marland Kitchen gabapentin (NEURONTIN) 300 MG capsule Take 300 mg by mouth 3 (three) times daily.   . hydroxychloroquine (PLAQUENIL) 200 MG tablet Take 1 tablet by mouth 2 (two) times daily. Reported on 03/12/2015  . PRESCRIPTION MEDICATION Patient is getting 2 cc of .05% Marcaine 1 cc 1000 mcg B12 By Dr. Berline Lopes once a month. Foot Block  . RESTASIS MULTIDOSE 0.05 % ophthalmic emulsion Place 1 drop into both eyes 2 (two) times daily.    No  facility-administered encounter medications on file as of 06/22/2017.      Objective: Blood pressure (!) 130/100, pulse 78, temperature (!) 97.5 F (36.4 C), temperature source Oral, resp. rate 18, height _0  (1.702 m), weight 168 lb 12.8 oz (76.6 kg). Patient is alert and in no acute distress. Conjunctiva is pink. Sclera is nonicteric Oropharyngeal mucosa is normal. No neck masses or thyromegaly noted. Cardiac exam with regular rhythm normal S1 and S2. No murmur or gallop noted. Lungs are clear to auscultation. Abdomenis soft and nontender without organomegaly or masses. No LE edema or clubbing noted.    Assessment:  #1.  Chronic GERD complicated by short segment Barrett's esophagus.  Patient's symptoms are well controlled with therapy.  Last EGD was 2 years ago.  #2.  Esophageal dysphagia.  She has a history of Schatzki's ring which was dilated/disrupted 2 years ago.  She still remains with sporadic dysphagia but it is not progressive.  Either the ring has recurred or she has esophageal motility disorder.  Will hold off EGD unless dysphagia versus.   Plan:  Patient will continue Esomeprazole at 40 mg p.o. every morning. Patient will call if dysphagia worsens or if she has an episode of food impaction in which case we will proceed with EGD in ED. Office visit in 1 year.

## 2017-06-22 NOTE — Patient Instructions (Signed)
Call if swallowing difficulty worsens or becomes more frequent.

## 2017-07-06 ENCOUNTER — Telehealth: Payer: Self-pay

## 2017-07-06 NOTE — Telephone Encounter (Signed)
Patient wants to wait on the beat blockers until sx's worsen, f/u apt made with Dr Harl Bowie

## 2017-07-06 NOTE — Telephone Encounter (Signed)
-----   Message from [place] sent at 07/05/2017  7:55 AM EDT -----   ----- Message ----- From: Arnoldo Lenis, MD Sent: 07/02/2017  12:20 PM To: Staci T Ashworth, CMA  Heart monitor looks good, no abnormal rhythms. If she is still having significaint issues with heart racing/skipping we could start her on lopressor 12.5mg  bid.F/u 2 Alphia Kava MD

## 2017-07-08 ENCOUNTER — Telehealth: Payer: Self-pay | Admitting: Cardiovascular Disease

## 2017-07-08 MED ORDER — METOPROLOL TARTRATE 25 MG PO TABS: 12.5000 mg | ORAL_TABLET | Freq: Two times a day (BID) | ORAL | 3 refills | Status: DC

## 2017-07-08 NOTE — Telephone Encounter (Signed)
Pt would like to start lopressor 12.5 mg BID as Dr. Harl Bowie had advised if her symptoms persisted. Sent RX to [facility]

## 2017-07-08 NOTE — Telephone Encounter (Signed)
Patient would like to speak with nurse regarding medication Dr.Branch was going to put her on. / tg

## 2017-09-13 ENCOUNTER — Ambulatory Visit: Payer: Medicare Other | Admitting: Cardiology

## 2017-09-17 ENCOUNTER — Ambulatory Visit: Payer: Medicare Other | Admitting: Cardiology

## 2017-10-12 ENCOUNTER — Other Ambulatory Visit (HOSPITAL_COMMUNITY): Payer: Self-pay | Admitting: Family Medicine

## 2017-10-12 DIAGNOSIS — Z1231 Encounter for screening mammogram for malignant neoplasm of breast: Secondary | ICD-10-CM

## 2017-11-05 ENCOUNTER — Encounter: Payer: Self-pay | Admitting: Cardiology

## 2017-11-05 ENCOUNTER — Ambulatory Visit (INDEPENDENT_AMBULATORY_CARE_PROVIDER_SITE_OTHER): Payer: Medicare Other | Admitting: Cardiology

## 2017-11-05 VITALS — BP 124/82 | HR 78 | Ht 66.0 in | Wt 168.0 lb

## 2017-11-05 DIAGNOSIS — R002 Palpitations: Secondary | ICD-10-CM | POA: Diagnosis not present

## 2017-11-05 MED ORDER — METOPROLOL TARTRATE 25 MG PO TABS: 25.0000 mg | ORAL_TABLET | Freq: Two times a day (BID) | ORAL | 1 refills | Status: DC

## 2017-11-05 NOTE — Progress Notes (Signed)
Clinical Summary Mackenzie Black is a 62 y.o.female seen today for follow up of the following medical problems.    1. Palpitations - off and on several years - feelilng of heart jumping. More often occurs with laying down or at rest -several episodes during a week. Variable in duration. No other associated symptoms.  - no EtoH. No coffee. Occasoinal tea. Drinks pretty frequent sodas, trying to change to caffeine free. Drinks hot [employer]. Takes bc powders that have caffeine.  - she reports a strong family history of afib.    05/2017 7 day event monitor no arrhythmias - she started lopressor 12.11m bid.  - ongoing symptoms, less frequent.  - has cut back on caffeine. Does bc powder still at times. .     2. Numbness left arm - left arm numbnes, shoulder down. Can feel like arm is asleep.  - sporadic   SH: several deaths in family over last year. Several close pets have passed away.  Past Medical History:  Diagnosis Date  . Back pain   . GERD (gastroesophageal reflux disease)   . Heart murmur   . Hypercholesteremia   . Neuropathy   . [place]umatoid [place]) [place]   . Seasonal allergies      Allergies  Allergen Reactions  . Humira [Adalimumab]     rash     Current Outpatient Medications  Medication Sig Dispense Refill  . Aspirin-Caffeine (BC FAST PAIN RELIEF) 845-65 MG PACK Take 65 mg by mouth. Patient states that she takes a BC 1 to 2 times a week for headache 56 each   . atorvastatin (LIPITOR) 10 MG tablet Take 10 mg by mouth daily at 6 PM.     . Cyanocobalamin (B-12) 1000 MCG/ML KIT Inject as directed every 30 (thirty) days.    .Marland Kitchendicyclomine (BENTYL) 10 MG capsule TAKE (1) CAPSULE THREE TIMES DAILY AS NEEDED. 60 capsule 3  . esomeprazole (NEXIUM) 40 MG capsule TAKE 1 CAPSULE DAILY BEFORE BREAKFAST. 30 capsule 11  . gabapentin (NEURONTIN) 300 MG capsule Take 300 mg by mouth 3 (three) times daily.     . hydroxychloroquine (PLAQUENIL) 200 MG tablet Take 1  tablet by mouth 2 (two) times daily. Reported on 03/12/2015    . metoprolol tartrate (LOPRESSOR) 25 MG tablet Take 0.5 tablets (12.5 mg total) by mouth 2 (two) times daily. 90 tablet 3  . PRESCRIPTION MEDICATION Patient is getting 2 cc of .05% Marcaine 1 cc 1000 mcg B12 By Dr. TBerline Lopesonce a month. Foot Block    . RESTASIS MULTIDOSE 0.05 % ophthalmic emulsion Place 1 drop into both eyes 2 (two) times daily.      No current facility-administered medications for this visit.      Past Surgical History:  Procedure Laterality Date  . Bilateral foot surgery    . COLONOSCOPY  03/31/2011   Procedure: COLONOSCOPY;  Surgeon: MJamesetta So MD;  [facility];  Service: Gastroenterology;  Laterality: N/A;  . COLONOSCOPY N/A 06/12/2016   Procedure: COLONOSCOPY;  Surgeon: NRogene Houston MD;  [facility];  Service: Endoscopy;  Laterality: N/A;  1205-moved to 1035  . ESOPHAGEAL DILATION N/A 07/12/2015   Procedure: ESOPHAGEAL DILATION;  Surgeon: NRogene Houston MD;  [facility];  Service: Endoscopy;  Laterality: N/A;  . ESOPHAGOGASTRODUODENOSCOPY N/A 07/12/2015   Procedure: ESOPHAGOGASTRODUODENOSCOPY (EGD);  Surgeon: NRogene Houston MD;  [facility];  Service: Endoscopy;  Laterality: N/A;  250-moved to  [place] to notify pt  . excision of cyst of hand    . KNEE SURGERY    . TONSILLECTOMY    . TUBAL LIGATION       Allergies  Allergen Reactions  . Humira [Adalimumab]     rash      Family History  Problem Relation Age of Onset  . Colon cancer Sister   . Colon cancer Brother   . Stroke Mother   . Cancer - Lung Father      Social History Mackenzie Black reports that she has quit smoking. Her smoking use included cigarettes. She has a 0.50 pack-year smoking history. She has never used smokeless tobacco. Mackenzie Black reports that she does not drink alcohol.   Review of Systems CONSTITUTIONAL: No weight loss, fever, chills, weakness or fatigue.    HEENT: Eyes: No visual loss, blurred vision, double vision or yellow sclerae.No hearing loss, sneezing, congestion, runny nose or sore throat.  SKIN: No rash or itching.  CARDIOVASCULAR: per hpi RESPIRATORY: No shortness of breath, cough or sputum.  GASTROINTESTINAL: No anorexia, nausea, vomiting or diarrhea. No abdominal pain or blood.  GENITOURINARY: No burning on urination, no polyuria NEUROLOGICAL: No headache, dizziness, syncope, paralysis, ataxia, numbness or tingling in the extremities. No change in bowel or bladder control.  MUSCULOSKELETAL: No muscle, back pain, joint pain or stiffness.  LYMPHATICS: No enlarged nodes. No history of splenectomy.  PSYCHIATRIC: No history of depression or anxiety.  ENDOCRINOLOGIC: No reports of sweating, cold or heat intolerance. No polyuria or polydipsia.  Marland Kitchen   Physical Examination Vitals:   11/05/17 1257  BP: 124/82  Pulse: 78  SpO2: 97%   Vitals:   11/05/17 1257  Weight: 168 lb (76.2 kg)  Height: _0  (1.676 m)    Gen: resting comfortably, no acute distress HEENT: no scleral icterus, pupils equal round and reactive, no palptable cervical adenopathy,  CV: RRR, no m/r/g, no jvd Resp: Clear to auscultation bilaterally GI: abdomen is soft, non-tender, non-distended, normal bowel sounds, no hepatosplenomegaly MSK: extremities are warm, no edema.  Skin: warm, no rash Neuro:  no focal deficits Psych: appropriate affect   Diagnostic Studies  05/2017 event monitor  7 day event monitor  Min HR 52, Max HR 117, Avg HR 73  No symptoms reported  Available tracings show normal sinus rhythm  No significant arrhythmias.   Assessment and Plan  1. Palpitations - benign cardiac monitor - symptoms improved with low dose beta blocker but not resolved, increase lopressor to 5m bid   F/u 6 months       JArnoldo Lenis M.D.

## 2017-11-05 NOTE — Patient Instructions (Signed)
Your physician wants you to follow-up in: [place] will receive a reminder letter in the mail two months in advance. If you don't receive a letter, please call our office to schedule the follow-up appointment.  Your physician has recommended you make the following change in your medication:   INCREASE METOPROLOL 25 MG TWICE DAILY  Thank you for choosing [facility]!!

## 2017-11-15 ENCOUNTER — Ambulatory Visit (HOSPITAL_COMMUNITY)
Admission: RE | Admit: 2017-11-15 | Discharge: 2017-11-15 | Disposition: A | Discharge status: Home / Self Care | Payer: Medicare Other | Source: Ambulatory Visit | Attending: Family Medicine | Admitting: Family Medicine

## 2017-11-15 DIAGNOSIS — Z1231 Encounter for screening mammogram for malignant neoplasm of breast: Secondary | ICD-10-CM

## 2017-12-01 ENCOUNTER — Other Ambulatory Visit (HOSPITAL_COMMUNITY): Payer: Self-pay | Admitting: Family Medicine

## 2017-12-01 DIAGNOSIS — G459 Transient cerebral ischemic attack, unspecified: Secondary | ICD-10-CM

## 2017-12-07 ENCOUNTER — Ambulatory Visit (HOSPITAL_COMMUNITY)
Admission: RE | Admit: 2017-12-07 | Discharge: 2017-12-07 | Disposition: A | Discharge status: Home / Self Care | Payer: Medicare Other | Source: Ambulatory Visit | Attending: Family Medicine | Admitting: Family Medicine

## 2017-12-07 DIAGNOSIS — G459 Transient cerebral ischemic attack, unspecified: Secondary | ICD-10-CM | POA: Insufficient documentation

## 2018-03-11 ENCOUNTER — Other Ambulatory Visit (INDEPENDENT_AMBULATORY_CARE_PROVIDER_SITE_OTHER): Payer: Self-pay | Admitting: *Deleted

## 2018-03-11 MED ORDER — LANSOPRAZOLE 30 MG PO CPDR: DELAYED_RELEASE_CAPSULE | ORAL | 3 refills | Status: DC

## 2018-03-11 NOTE — Telephone Encounter (Signed)
The other PPI's that are covered are Omeprazole capsules , Pantoprazole tablets, Dexilant Capsules.

## 2018-03-11 NOTE — Telephone Encounter (Signed)
Patient's Insurance changed the PPI's they will cover this year. Per the list provided , Dr.Rehman ask that we call in the Lansoprazole 30 mg - Patient will take 1 by mouth every morning. # 30 5 refills.

## 2018-05-12 ENCOUNTER — Other Ambulatory Visit: Payer: Self-pay | Admitting: Cardiology

## 2018-05-18 ENCOUNTER — Telehealth: Payer: Medicare Other | Admitting: Cardiology

## 2018-05-18 ENCOUNTER — Ambulatory Visit: Payer: Medicare Other | Admitting: Cardiology

## 2018-05-31 ENCOUNTER — Telehealth: Payer: Medicare Other | Admitting: Cardiology

## 2018-08-01 ENCOUNTER — Telehealth: Payer: Self-pay | Admitting: Cardiology

## 2018-08-01 NOTE — Telephone Encounter (Signed)

## 2018-08-02 ENCOUNTER — Other Ambulatory Visit: Payer: Self-pay

## 2018-08-02 ENCOUNTER — Encounter: Payer: Self-pay | Admitting: Cardiology

## 2018-08-02 ENCOUNTER — Telehealth (INDEPENDENT_AMBULATORY_CARE_PROVIDER_SITE_OTHER): Payer: Medicare Other | Admitting: Cardiology

## 2018-08-02 VITALS — Ht 65.0 in | Wt 180.0 lb

## 2018-08-02 DIAGNOSIS — R002 Palpitations: Secondary | ICD-10-CM | POA: Diagnosis not present

## 2018-08-02 NOTE — Progress Notes (Signed)
Virtual Visit via Telephone Note   This visit type was conducted due to national recommendations for restrictions regarding the COVID-19 Pandemic (e.g. social distancing) in an effort to limit this patient's exposure and mitigate transmission in our community.  Due to her co-morbid illnesses, this patient is at least at moderate risk for complications without adequate follow up.  This format is felt to be most appropriate for this patient at this time.  The patient did not have access to video technology/had technical difficulties with video requiring transitioning to audio format only (telephone).  All issues noted in this document were discussed and addressed.  No physical exam could be performed with this format.  Please refer to the patient's chart for her  consent to telehealth for [facility].   Date:  08/02/2018   ID:  Mackenzie Black, DOB [birth year], MRN [id number]  Patient Location: Home Provider Location: Office  PCP:  Mackenzie Evens, MD  Cardiologist:  Carlyle Dolly, MD  Electrophysiologist:  None   Evaluation Performed:  Follow-Up Visit  Chief Complaint:  6 months  History of Present Illness:    Mackenzie Black is a 63 y.o. female seen today for follow up of the following medical problems.    1. Palpitations -off and on several years - feelilng of heart jumping. More often occurs with laying down or at rest -several episodes during a week. Variable in duration. No other associated symptoms.  - no EtoH. No coffee. Occasoinal tea. Drinks pretty frequent sodas, trying to change to caffeine free. Drinks hot [employer]. Takes bc powdersthat have caffeine. - she reports a strong family history of afib.   05/2017 7 day event monitor no arrhythmias   - occasional palpitations at time, infrequent.Continuing to limit caffeine.      The patient does not have symptoms concerning for COVID-19 infection (fever, chills, cough, or new shortness of  breath).    Past Medical History:  Diagnosis Date  . Back pain   . GERD (gastroesophageal reflux disease)   . Heart murmur   . Hypercholesteremia   . Neuropathy   . RA (rheumatoid arthritis) ([place])   . Seasonal allergies    Past Surgical History:  Procedure Laterality Date  . Bilateral foot surgery    . COLONOSCOPY  03/31/2011   Procedure: COLONOSCOPY;  Surgeon: Mackenzie So, MD;  [facility];  Service: Gastroenterology;  Laterality: N/A;  . COLONOSCOPY N/A 06/12/2016   Procedure: COLONOSCOPY;  Surgeon: Mackenzie Houston, MD;  [facility];  Service: Endoscopy;  Laterality: N/A;  1205-moved to 1035  . ESOPHAGEAL DILATION N/A 07/12/2015   Procedure: ESOPHAGEAL DILATION;  Surgeon: Mackenzie Houston, MD;  [facility];  Service: Endoscopy;  Laterality: N/A;  . ESOPHAGOGASTRODUODENOSCOPY N/A 07/12/2015   Procedure: ESOPHAGOGASTRODUODENOSCOPY (EGD);  Surgeon: Mackenzie Houston, MD;  [facility];  Service: Endoscopy;  Laterality: N/A;  250-moved to 1245 Ann to notify pt  . excision of cyst of hand    . KNEE SURGERY    . TONSILLECTOMY    . TUBAL LIGATION       Current Meds  Medication Sig  . Aspirin-Caffeine (BC FAST PAIN RELIEF) 845-65 MG PACK Take 65 mg by mouth. Patient states that she takes a BC 1 to 2 times a week for headache  . atorvastatin (LIPITOR) 10 MG tablet Take 10 mg by mouth daily at 6 PM.   . bupivacaine (MARCAINE) 0.5 % injection 50 mLs by  Infiltration route once.  . Cyanocobalamin (B-12) 1000 MCG/ML KIT Inject as directed every 30 (thirty) days.  Marland Kitchen dicyclomine (BENTYL) 10 MG capsule TAKE (1) CAPSULE THREE TIMES DAILY AS NEEDED.  Marland Kitchen gabapentin (NEURONTIN) 300 MG capsule Take 300 mg by mouth 3 (three) times daily.   . hydroxychloroquine (PLAQUENIL) 200 MG tablet Take 1 tablet by mouth 2 (two) times daily. Reported on 03/12/2015  . lansoprazole (PREVACID) 30 MG capsule Take 1 by mouth every morning.  . metoprolol tartrate (LOPRESSOR)  25 MG tablet TAKE (1) TABLET BY MOUTH TWICE DAILY.  Marland Kitchen PRESCRIPTION MEDICATION Patient is getting 2 cc of .05% Marcaine 1 cc 1000 mcg B12 By Dr. Berline Black once a month. Foot Block  . RESTASIS MULTIDOSE 0.05 % ophthalmic emulsion Place 1 drop into both eyes 2 (two) times daily.   . [DISCONTINUED] esomeprazole (NEXIUM) 40 MG capsule TAKE 1 CAPSULE DAILY BEFORE BREAKFAST.     Allergies:   Humira [adalimumab]   Social History   Tobacco Use  . Smoking status: Former Smoker    Packs/day: 0.25    Years: 2.00    Pack years: 0.50    Types: Cigarettes  . Smokeless tobacco: Never Used  Substance Use Topics  . Alcohol use: No    Alcohol/week: 0.0 standard drinks  . Drug use: No     Family Hx: The patient's family history includes Cancer - Lung in her father; Colon cancer in her brother and sister; Stroke in her mother.  ROS:   Please see the history of present illness.     All other systems reviewed and are negative.   Prior CV studies:   The following studies were reviewed today:  05/2017 event monitor  7 day event monitor  Min HR 52, Max HR 117, Avg HR 73  No symptoms reported  Available tracings show normal sinus rhythm  No significant arrhythmias.  Labs/Other Tests and Data Reviewed:    EKG:  No ECG reviewed.  Recent Labs: No results found for requested labs within last 8760 hours.   Recent Lipid Panel No results found for: CHOL, TRIG, HDL, CHOLHDL, LDLCALC, LDLDIRECT  Wt Readings from Last 3 Encounters:  08/02/18 180 lb (81.6 kg)  11/05/17 168 lb (76.2 kg)  06/22/17 168 lb 12.8 oz (76.6 kg)     Objective:    Vital Signs:  Ht 5' 5"  (1.651 m)   Wt 180 lb (81.6 kg)   BMI 29.95 kg/m    Today's Vitals   08/02/18 1237  Weight: 180 lb (81.6 kg)  Height: 5' 5"  (1.651 m)   Body mass index is 29.95 kg/m.  Normal affect. Normal speech pattern and tone. Comfortable, no signs of distress. No audible signs of SOB or wheezing.   ASSESSMENT & PLAN:    1.  Palpitations - benign cardiac monitor - has done well on low dose lopressor, continue current therapy  COVID-19 Education: The signs and symptoms of COVID-19 were discussed with the patient and how to seek care for testing (follow up with PCP or arrange E-visit).  The importance of social distancing was discussed today.  Time:   Today, I have spent 10 minutes with the patient with telehealth technology discussing the above problems.     Medication Adjustments/Labs and Tests Ordered: Current medicines are reviewed at length with the patient today.  Concerns regarding medicines are outlined above.   Tests Ordered: No orders of the defined types were placed in this encounter.   Medication Changes: No orders of  the defined types were placed in this encounter.   Follow Up:  6 month f/u in clinic  Signed, Carlyle Dolly, MD  08/02/2018 12:49 PM    [place]

## 2018-08-02 NOTE — Patient Instructions (Signed)

## 2018-08-11 ENCOUNTER — Other Ambulatory Visit: Payer: Self-pay | Admitting: Cardiology

## 2018-09-06 ENCOUNTER — Other Ambulatory Visit: Payer: Self-pay

## 2018-09-06 ENCOUNTER — Other Ambulatory Visit (INDEPENDENT_AMBULATORY_CARE_PROVIDER_SITE_OTHER): Payer: Self-pay | Admitting: *Deleted

## 2018-09-06 ENCOUNTER — Ambulatory Visit (INDEPENDENT_AMBULATORY_CARE_PROVIDER_SITE_OTHER): Payer: Medicare Other | Admitting: Internal Medicine

## 2018-09-06 ENCOUNTER — Encounter (INDEPENDENT_AMBULATORY_CARE_PROVIDER_SITE_OTHER): Payer: Self-pay | Admitting: Internal Medicine

## 2018-09-06 DIAGNOSIS — K2270 Barrett's esophagus without dysplasia: Secondary | ICD-10-CM

## 2018-09-06 DIAGNOSIS — K219 Gastro-esophageal reflux disease without esophagitis: Secondary | ICD-10-CM

## 2018-09-06 DIAGNOSIS — R1310 Dysphagia, unspecified: Secondary | ICD-10-CM

## 2018-09-06 DIAGNOSIS — K22719 Barrett's esophagus with dysplasia, unspecified: Secondary | ICD-10-CM

## 2018-09-06 DIAGNOSIS — R1319 Other dysphagia: Secondary | ICD-10-CM

## 2018-09-06 NOTE — Patient Instructions (Signed)
Esophagogastroduodenoscopy with esophageal dilation to be scheduled. 

## 2018-09-06 NOTE — Progress Notes (Signed)
Presenting complaint;  Follow-up for chronic GERD/Barrett's esophagus. Patient complains of difficulty swallowing solids.  Database and subjective:  Patient is [name] who has chronic GERD complicated by short segment Barrett's esophagus who underwent esophageal dilation back in May 2017.  She was last seen in May 2019 and is here for scheduled visit. She is states she has been having swallowing difficulty for 6 months.  She experiences difficulty primarily with meat and bread.  She says food bolus lodges at lower sternal area and she gets relieved after few seconds a few minutes.  She has having dysphagia every week but not daily.  She has heartburn occasionally with certain foods.  Her appetite is good.  She has gained 10 pounds since her last visit.  She denies melena rectal bleeding or abdominal pain. Her last high risk screening colonoscopy was in April 2018.  Current Medications: Outpatient Encounter Medications as of 09/06/2018  Medication Sig  . aspirin EC 81 MG tablet Take 81 mg by mouth daily.  . Aspirin-Caffeine (BC FAST PAIN RELIEF) 845-65 MG PACK Take 65 mg by mouth. Patient states that she takes a BC 1 to 2 times a week for headache  . atorvastatin (LIPITOR) 10 MG tablet Take 10 mg by mouth daily at 6 PM.   . bupivacaine (MARCAINE) 0.5 % injection 50 mLs by Infiltration route once.  . Cyanocobalamin (B-12) 1000 MCG/ML KIT Inject as directed every 30 (thirty) days.  Marland Kitchen gabapentin (NEURONTIN) 300 MG capsule Take 300 mg by mouth 3 (three) times daily.   . hydroxychloroquine (PLAQUENIL) 200 MG tablet Take 1 tablet by mouth 2 (two) times daily. Reported on 03/12/2015  . lansoprazole (PREVACID) 30 MG capsule Take 1 by mouth every morning.  . metoprolol tartrate (LOPRESSOR) 25 MG tablet TAKE (1) TABLET BY MOUTH TWICE DAILY.  Marland Kitchen PRESCRIPTION MEDICATION Patient is getting 2 cc of .05% Marcaine 1 cc 1000 mcg B12 By Dr. Berline Lopes once a month. Foot Block  . RESTASIS  MULTIDOSE 0.05 % ophthalmic emulsion Place 1 drop into both eyes 2 (two) times daily.   . [DISCONTINUED] dicyclomine (BENTYL) 10 MG capsule TAKE (1) CAPSULE THREE TIMES DAILY AS NEEDED. (Patient not taking: Reported on 09/06/2018)   No facility-administered encounter medications on file as of 09/06/2018.      Objective: Blood pressure 125/78, pulse 61, temperature 98.1 F (36.7 C), temperature source Oral, resp. rate 18, height _0  (1.702 m), weight 178 lb 1.6 oz (80.8 kg). Patient is alert and in no acute distress. Conjunctiva is pink. Sclera is nonicteric Oropharyngeal mucosa is normal. No neck masses or thyromegaly noted. Cardiac exam with regular rhythm normal S1 and S2. No murmur or gallop noted. Lungs are clear to auscultation. Abdomen is full but soft and nontender with organomegaly or masses. No LE edema or clubbing noted.   Assessment:  #1.  Chronic GERD complicated by short segment Barrett's esophagus.  GERD symptoms are well controlled with dietary measures and PPI.  #2.  Esophageal dysphagia.  She has a history of Schatzki's ring which is last dilated and started in May 2017.  Since she is having frequent solid food dysphagia she would benefit from repeat esophageal dilation.  #3.  CRC screening.  Patient is deemed to be high risk because her sister had colon carcinoma and another sister had multiple tubular adenomas.  She is up-to-date.  Her last colonoscopy was in April 2018 and the next one would be in April 2023.  Plan:  Continue lansoprazole at  current dose of 30 mg by mouth 30 minutes before breakfast daily. Antireflux measures reinforced. Esophagogastroduodenoscopy with esophageal dilation to be performed in near future. Patient informed that she would have to be screened for COVID-19 prior to the procedure. Office visit in 1 year.

## 2018-09-07 ENCOUNTER — Encounter (INDEPENDENT_AMBULATORY_CARE_PROVIDER_SITE_OTHER): Payer: Self-pay | Admitting: *Deleted

## 2018-09-07 DIAGNOSIS — K219 Gastro-esophageal reflux disease without esophagitis: Secondary | ICD-10-CM | POA: Insufficient documentation

## 2018-09-07 DIAGNOSIS — K22719 Barrett's esophagus with dysplasia, unspecified: Secondary | ICD-10-CM | POA: Insufficient documentation

## 2018-10-11 ENCOUNTER — Encounter (HOSPITAL_COMMUNITY)
Admission: RE | Admit: 2018-10-11 | Discharge: 2018-10-11 | Disposition: A | Discharge status: Home / Self Care | Payer: Medicare Other | Source: Ambulatory Visit | Attending: Internal Medicine | Admitting: Internal Medicine

## 2018-10-11 ENCOUNTER — Other Ambulatory Visit: Payer: Self-pay

## 2018-10-11 ENCOUNTER — Other Ambulatory Visit (HOSPITAL_COMMUNITY)
Admission: RE | Admit: 2018-10-11 | Discharge: 2018-10-11 | Disposition: A | Discharge status: Home / Self Care | Payer: Medicare Other | Source: Ambulatory Visit | Attending: Internal Medicine | Admitting: Internal Medicine

## 2018-10-11 ENCOUNTER — Encounter (HOSPITAL_COMMUNITY): Payer: Self-pay

## 2018-10-11 DIAGNOSIS — E7800 Pure hypercholesterolemia, unspecified: Secondary | ICD-10-CM | POA: Diagnosis not present

## 2018-10-11 DIAGNOSIS — Z87891 Personal history of nicotine dependence: Secondary | ICD-10-CM | POA: Diagnosis not present

## 2018-10-11 DIAGNOSIS — Z20828 Contact with and (suspected) exposure to other viral communicable diseases: Secondary | ICD-10-CM | POA: Insufficient documentation

## 2018-10-11 DIAGNOSIS — K2270 Barrett's esophagus without dysplasia: Secondary | ICD-10-CM | POA: Diagnosis not present

## 2018-10-11 DIAGNOSIS — M069 Rheumatoid arthritis, unspecified: Secondary | ICD-10-CM | POA: Diagnosis not present

## 2018-10-11 DIAGNOSIS — Z888 Allergy status to other drugs, medicaments and biological substances status: Secondary | ICD-10-CM | POA: Diagnosis not present

## 2018-10-11 DIAGNOSIS — K222 Esophageal obstruction: Secondary | ICD-10-CM | POA: Diagnosis not present

## 2018-10-11 DIAGNOSIS — R1310 Dysphagia, unspecified: Secondary | ICD-10-CM

## 2018-10-11 DIAGNOSIS — Z79899 Other long term (current) drug therapy: Secondary | ICD-10-CM | POA: Diagnosis not present

## 2018-10-11 DIAGNOSIS — K22719 Barrett's esophagus with dysplasia, unspecified: Secondary | ICD-10-CM

## 2018-10-11 DIAGNOSIS — R1319 Other dysphagia: Secondary | ICD-10-CM

## 2018-10-11 DIAGNOSIS — K219 Gastro-esophageal reflux disease without esophagitis: Secondary | ICD-10-CM

## 2018-10-11 DIAGNOSIS — Z01812 Encounter for preprocedural laboratory examination: Secondary | ICD-10-CM | POA: Insufficient documentation

## 2018-10-11 DIAGNOSIS — R197 Diarrhea, unspecified: Secondary | ICD-10-CM | POA: Diagnosis not present

## 2018-10-11 DIAGNOSIS — R1314 Dysphagia, pharyngoesophageal phase: Secondary | ICD-10-CM | POA: Diagnosis not present

## 2018-10-11 DIAGNOSIS — J302 Other seasonal allergic rhinitis: Secondary | ICD-10-CM | POA: Diagnosis not present

## 2018-10-11 DIAGNOSIS — R109 Unspecified abdominal pain: Secondary | ICD-10-CM | POA: Diagnosis not present

## 2018-10-11 DIAGNOSIS — Z800 Family history of malignant neoplasm of digestive organs: Secondary | ICD-10-CM | POA: Diagnosis not present

## 2018-10-11 DIAGNOSIS — G629 Polyneuropathy, unspecified: Secondary | ICD-10-CM | POA: Diagnosis not present

## 2018-10-11 DIAGNOSIS — Z7982 Long term (current) use of aspirin: Secondary | ICD-10-CM | POA: Diagnosis not present

## 2018-10-11 DIAGNOSIS — K449 Diaphragmatic hernia without obstruction or gangrene: Secondary | ICD-10-CM | POA: Diagnosis not present

## 2018-10-11 LAB — SARS CORONAVIRUS 2 (TAT 6-24 HRS): SARS Coronavirus 2: NEGATIVE

## 2018-10-14 ENCOUNTER — Ambulatory Visit (HOSPITAL_COMMUNITY)
Admission: RE | Admit: 2018-10-14 | Discharge: 2018-10-14 | Disposition: A | Discharge status: Home / Self Care | Payer: Medicare Other | Attending: Internal Medicine | Admitting: Internal Medicine

## 2018-10-14 ENCOUNTER — Other Ambulatory Visit: Payer: Self-pay

## 2018-10-14 ENCOUNTER — Ambulatory Visit (HOSPITAL_COMMUNITY): Payer: Medicare Other | Admitting: Anesthesiology

## 2018-10-14 ENCOUNTER — Encounter (HOSPITAL_COMMUNITY)
Admission: RE | Disposition: A | Discharge status: Home / Self Care | Payer: Self-pay | Source: Home / Self Care | Attending: Internal Medicine

## 2018-10-14 ENCOUNTER — Encounter (HOSPITAL_COMMUNITY): Payer: Self-pay | Admitting: Anesthesiology

## 2018-10-14 DIAGNOSIS — Z800 Family history of malignant neoplasm of digestive organs: Secondary | ICD-10-CM | POA: Insufficient documentation

## 2018-10-14 DIAGNOSIS — K449 Diaphragmatic hernia without obstruction or gangrene: Secondary | ICD-10-CM | POA: Insufficient documentation

## 2018-10-14 DIAGNOSIS — M069 Rheumatoid arthritis, unspecified: Secondary | ICD-10-CM | POA: Insufficient documentation

## 2018-10-14 DIAGNOSIS — Z888 Allergy status to other drugs, medicaments and biological substances status: Secondary | ICD-10-CM | POA: Insufficient documentation

## 2018-10-14 DIAGNOSIS — R1319 Other dysphagia: Secondary | ICD-10-CM

## 2018-10-14 DIAGNOSIS — K2270 Barrett's esophagus without dysplasia: Secondary | ICD-10-CM | POA: Insufficient documentation

## 2018-10-14 DIAGNOSIS — K222 Esophageal obstruction: Secondary | ICD-10-CM

## 2018-10-14 DIAGNOSIS — R1314 Dysphagia, pharyngoesophageal phase: Secondary | ICD-10-CM | POA: Insufficient documentation

## 2018-10-14 DIAGNOSIS — Z87891 Personal history of nicotine dependence: Secondary | ICD-10-CM | POA: Insufficient documentation

## 2018-10-14 DIAGNOSIS — J302 Other seasonal allergic rhinitis: Secondary | ICD-10-CM | POA: Insufficient documentation

## 2018-10-14 DIAGNOSIS — K22719 Barrett's esophagus with dysplasia, unspecified: Secondary | ICD-10-CM | POA: Insufficient documentation

## 2018-10-14 DIAGNOSIS — R109 Unspecified abdominal pain: Secondary | ICD-10-CM | POA: Insufficient documentation

## 2018-10-14 DIAGNOSIS — Z7982 Long term (current) use of aspirin: Secondary | ICD-10-CM | POA: Insufficient documentation

## 2018-10-14 DIAGNOSIS — Z20828 Contact with and (suspected) exposure to other viral communicable diseases: Secondary | ICD-10-CM | POA: Diagnosis not present

## 2018-10-14 DIAGNOSIS — K219 Gastro-esophageal reflux disease without esophagitis: Secondary | ICD-10-CM | POA: Insufficient documentation

## 2018-10-14 DIAGNOSIS — Z79899 Other long term (current) drug therapy: Secondary | ICD-10-CM | POA: Insufficient documentation

## 2018-10-14 DIAGNOSIS — R1310 Dysphagia, unspecified: Secondary | ICD-10-CM

## 2018-10-14 DIAGNOSIS — E7800 Pure hypercholesterolemia, unspecified: Secondary | ICD-10-CM | POA: Insufficient documentation

## 2018-10-14 DIAGNOSIS — G629 Polyneuropathy, unspecified: Secondary | ICD-10-CM | POA: Insufficient documentation

## 2018-10-14 DIAGNOSIS — R197 Diarrhea, unspecified: Secondary | ICD-10-CM | POA: Insufficient documentation

## 2018-10-14 HISTORY — PX: ESOPHAGOGASTRODUODENOSCOPY (EGD) WITH PROPOFOL: SHX5813

## 2018-10-14 HISTORY — PX: ESOPHAGEAL DILATION: SHX303

## 2018-10-14 SURGERY — ESOPHAGOGASTRODUODENOSCOPY (EGD) WITH PROPOFOL
Anesthesia: Monitor Anesthesia Care

## 2018-10-14 MED ORDER — KETAMINE HCL 50 MG/5ML IJ SOSY
PREFILLED_SYRINGE | INTRAMUSCULAR | Status: AC
  Filled 2018-10-14: qty 5

## 2018-10-14 MED ORDER — PROPOFOL 10 MG/ML IV BOLUS
INTRAVENOUS | Status: DC | PRN
  Administered 2018-10-14 (×2): 10 mg via INTRAVENOUS
  Administered 2018-10-14 (×3): 20 mg via INTRAVENOUS

## 2018-10-14 MED ORDER — PROPOFOL 10 MG/ML IV BOLUS
INTRAVENOUS | Status: AC
  Filled 2018-10-14: qty 40

## 2018-10-14 MED ORDER — LACTATED RINGERS IV SOLN
INTRAVENOUS | Status: DC | PRN
  Administered 2018-10-14: 08:00:00 via INTRAVENOUS

## 2018-10-14 MED ORDER — LACTATED RINGERS IV SOLN
Freq: Once | INTRAVENOUS | Status: AC
  Administered 2018-10-14: 08:00:00 via INTRAVENOUS

## 2018-10-14 MED ORDER — PROPOFOL 500 MG/50ML IV EMUL
INTRAVENOUS | Status: DC | PRN
  Administered 2018-10-14: 150 ug/kg/min via INTRAVENOUS

## 2018-10-14 NOTE — Anesthesia Postprocedure Evaluation (Signed)
Anesthesia Post Note  Patient: Mackenzie Black  Procedure(s) Performed: ESOPHAGOGASTRODUODENOSCOPY (EGD) WITH PROPOFOL (N/A ) ESOPHAGEAL DILATION (N/A )  Patient location during evaluation: PACU Anesthesia Type: General Level of consciousness: awake and alert and oriented Pain management: pain level controlled Vital Signs Assessment: post-procedure vital signs reviewed and stable Respiratory status: spontaneous breathing Cardiovascular status: blood pressure returned to baseline and stable Postop Assessment: no apparent nausea or vomiting     Last Vitals:  Vitals:   10/14/18 0756  BP: 126/82  Pulse: 62  Resp: (!) 21  Temp: (!) 36.3 C  SpO2: 99%    Last Pain:  Vitals:   10/14/18 0756  TempSrc: Oral  PainSc: 0-No pain                 Badr Piedra

## 2018-10-14 NOTE — Op Note (Signed)
[facility] Patient Name: Mackenzie Black Procedure Date: 10/14/2018 8:15 AM MRN: [id number] Date of Birth: 1955-05-02 Attending MD: Hildred Laser , MD CSN: [id number] Age: 63 Admit Type: Outpatient Procedure:                Upper GI endoscopy Indications:              Esophageal dysphagia Providers:                Hildred Laser, MD, Hinton Rao, RN, Raphael Gibney, Technician Referring MD:             Lemmie Evens, MD Medicines:                Propofol per Anesthesia Complications:            No immediate complications. Estimated Blood Loss:     Estimated blood loss: none. Procedure:                Pre-Anesthesia Assessment:                           - Prior to the procedure, a History and Physical                            was performed, and patient medications and                            allergies were reviewed. The patient's tolerance of                            previous anesthesia was also reviewed. The risks                            and benefits of the procedure and the sedation                            options and risks were discussed with the patient.                            All questions were answered, and informed consent                            was obtained. Prior Anticoagulants: The patient has                            taken no previous anticoagulant or antiplatelet                            agents except for aspirin. ASA Grade Assessment: II                            - A patient with mild systemic disease. After  reviewing the risks and benefits, the patient was                            deemed in satisfactory condition to undergo the                            procedure.                           After obtaining informed consent, the endoscope was                            passed under direct vision. Throughout the                            procedure, the patient's blood pressure, pulse,  and                            oxygen saturations were monitored continuously. The                            [id number]) was introduced through the                            mouth, and advanced to the second part of duodenum.                            The upper GI endoscopy was accomplished without                            difficulty. The patient tolerated the procedure                            well. Scope In: 8:38:34 AM Scope Out: 8:51:38 AM Total Procedure Duration: 0 hours 13 minutes 4 seconds  Findings:      There were esophageal mucosal changes secondary to established Barrett's       disease present in the distal esophagus.      A low-grade of narrowing Schatzki ring was found at the gastroesophageal       junction.      The scope was withdrawn. Dilation was attempted, but the lesion was not       amenable to treatment at the cricopharyngeus with a Maloney dilator       because the dilator could not be passed. A TTS dilator was passed       through the scope. Dilation with an 18-19-20 mm balloon dilator was       performed to 18 mm and 19 mm at the gastroesophageal junction. No       mucosal disruption noted.      A 4 cm hiatal hernia was present.      The entire examined stomach was normal.      The duodenal bulb and second portion of the duodenum were normal. Impression:               - Esophageal mucosal changes secondary to  established Barrett's disease. Single small patch.                           - Low-grade of narrowing Schatzki ring. Dilated                            with balloon. Could not pass Maloney past                            hypopharynx,                           - 4 cm hiatal hernia.                           - Normal stomach.                           - Normal duodenal bulb and second portion of the                            duodenum.                           - No specimens collected. Moderate Sedation:      Per  Anesthesia Care Recommendation:           - Patient has a contact number available for                            emergencies. The signs and symptoms of potential                            delayed complications were discussed with the                            patient. Return to normal activities tomorrow.                            Written discharge instructions were provided to the                            patient.                           - Resume previous diet today.                           - Continue present medications.                           - Resume aspirin at prior dose today.                           - Telephone GI clinic in 1 week. Procedure Code(s):        --- Professional ---  [phone], Esophagogastroduodenoscopy, flexible,                            transoral; with transendoscopic balloon dilation of                            esophagus (less than 30 mm diameter) Diagnosis Code(s):        --- Professional ---                           K22.70, Barrett's esophagus without dysplasia                           K22.2, Esophageal obstruction                           K44.9, Diaphragmatic hernia without obstruction or                            gangrene                           R13.14, Dysphagia, pharyngoesophageal phase CPT copyright 2019 [employer]. All rights reserved. The codes documented in this report are preliminary and upon coder review may  be revised to meet current compliance requirements. Hildred Laser, MD Hildred Laser, MD 10/14/2018 9:03:24 AM This report has been signed electronically. Number of Addenda: 0

## 2018-10-14 NOTE — H&P (Signed)
Mackenzie Black is an 63 y.o. female.   Chief Complaint: Patient is here for EGD and ED. HPI: Patient is [name] who has chronic GERD complicated by short segment Barrett's esophagus as well as history of Schatzki's ring last dilated/disrupted in May 2017 who now presents with intermittent solid food dysphagia.  Heartburn is well controlled with therapy.  Patient reports having sudden onset of abdominal pain and diarrhea 3 days ago.  She noted small amount of blood after bowel movements.  Symptoms lasted for about 24 hours.  She is now pain-free.  She did not experience nausea vomiting or fever.  No history of recent antibiotic use. She has been off aspirin for 2 days.  Past Medical History:  Diagnosis Date  . Back pain   . GERD (gastroesophageal reflux disease)   . Heart murmur   . Hypercholesteremia   . Neuropathy   . RA (rheumatoid arthritis) ([place])   . Seasonal allergies     Past Surgical History:  Procedure Laterality Date  . Bilateral foot surgery    . COLONOSCOPY  03/31/2011   Procedure: COLONOSCOPY;  Surgeon: Jamesetta So, MD;  [facility];  Service: Gastroenterology;  Laterality: N/A;  . COLONOSCOPY N/A 06/12/2016   Procedure: COLONOSCOPY;  Surgeon: Rogene Houston, MD;  [facility];  Service: Endoscopy;  Laterality: N/A;  1205-moved to 1035  . ESOPHAGEAL DILATION N/A 07/12/2015   Procedure: ESOPHAGEAL DILATION;  Surgeon: Rogene Houston, MD;  [facility];  Service: Endoscopy;  Laterality: N/A;  . ESOPHAGOGASTRODUODENOSCOPY N/A 07/12/2015   Procedure: ESOPHAGOGASTRODUODENOSCOPY (EGD);  Surgeon: Rogene Houston, MD;  [facility];  Service: Endoscopy;  Laterality: N/A;  250-moved to 1245 Ann to notify pt  . excision of cyst of hand    . KNEE SURGERY    . TONSILLECTOMY    . TUBAL LIGATION      Family History  Problem Relation Age of Onset  . Colon cancer Sister   . Colon cancer Brother   . Stroke Mother   .  Cancer - Lung Father    Social History:  reports that she has quit smoking. Her smoking use included cigarettes. She has a 0.50 pack-year smoking history. She has never used smokeless tobacco. She reports that she does not drink alcohol or use drugs.  Allergies:  Allergies  Allergen Reactions  . Humira [Adalimumab]     rash    Medications Prior to Admission  Medication Sig Dispense Refill  . aspirin EC 81 MG tablet Take 81 mg by mouth daily.    . Aspirin-Caffeine (BC FAST PAIN RELIEF) 845-65 MG PACK Take 1 Package by mouth daily as needed (headache). Patient states that she takes a BC 1 to 2 times a week for headache 56 each   . atorvastatin (LIPITOR) 20 MG tablet Take 10 mg by mouth daily at 6 PM.     . Cyanocobalamin (B-12) 1000 MCG/ML KIT Inject as directed every 30 (thirty) days.    Marland Kitchen gabapentin (NEURONTIN) 300 MG capsule Take 300 mg by mouth 2 (two) times daily.     . hydroxychloroquine (PLAQUENIL) 200 MG tablet Take 200 mg by mouth 2 (two) times daily. Reported on 03/12/2015    . lansoprazole (PREVACID) 30 MG capsule Take 1 by mouth every morning. 90 capsule 3  . metoprolol tartrate (LOPRESSOR) 25 MG tablet TAKE (1) TABLET BY MOUTH TWICE DAILY. (Patient taking differently: Take 25 mg by mouth 2 (two)  times daily. ) 180 tablet 0  . RESTASIS MULTIDOSE 0.05 % ophthalmic emulsion Place 1 drop into both eyes 2 (two) times daily.     Marland Kitchen PRESCRIPTION MEDICATION Patient is getting 2 cc of .05% Marcaine 1 cc 1000 mcg B12 By Dr. Berline Lopes once a month. Foot Block      No results found for this or any previous visit (from the past 48 hour(s)). No results found.  ROS  Blood pressure 126/82, pulse 62, temperature (!) 97.4 F (36.3 C), temperature source Oral, resp. rate (!) 21, height 5' 7"  (1.702 m), weight 80.7 kg, SpO2 99 %. Physical Exam  Constitutional: She appears well-developed and well-nourished.  HENT:  Mouth/Throat: Oropharynx is clear and moist.  Eyes: Conjunctivae are normal. No  scleral icterus.  Neck: No thyromegaly present.  Cardiovascular: Normal rate, regular rhythm and normal heart sounds.  No murmur heard. Respiratory: Effort normal and breath sounds normal.  GI:  Abdomen is full but soft and nontender with organomegaly or masses.  Musculoskeletal:        General: No edema.  Lymphadenopathy:    She has no cervical adenopathy.  Neurological: She is alert.  Skin: Skin is warm and dry.     Assessment/Plan Esophageal dysphagia and patient with chronic GERD known Barrett's and Schatzki's ring. Esophagogastroduodenoscopy with esophageal dilation.  Hildred Laser, MD 10/14/2018, 8:26 AM

## 2018-10-14 NOTE — Discharge Instructions (Signed)
Resume usual medications including aspirin as before. Resume usual diet. No driving for 24 hours. Please call office with progress report next week.   Monitored Anesthesia Care, Care After These instructions provide you with information about caring for yourself after your procedure. Your health care provider may also give you more specific instructions. Your treatment has been planned according to current medical practices, but problems sometimes occur. Call your health care provider if you have any problems or questions after your procedure. What can I expect after the procedure? After your procedure, you may:  Feel sleepy for several hours.  Feel clumsy and have poor balance for several hours.  Feel forgetful about what happened after the procedure.  Have poor judgment for several hours.  Feel nauseous or vomit.  Have a sore throat if you had a breathing tube during the procedure. Follow these instructions at home: For at least 24 hours after the procedure:      Have a responsible adult stay with you. It is important to have someone help care for you until you are awake and alert.  Rest as needed.  Do not: ? Participate in activities in which you could fall or become injured. ? Drive. ? Use heavy machinery. ? Drink alcohol. ? Take sleeping pills or medicines that cause drowsiness. ? Make important decisions or sign legal documents. ? Take care of children on your own. Eating and drinking  Follow the diet that is recommended by your health care provider.  If you vomit, drink water, juice, or soup when you can drink without vomiting.  Make sure you have little or no nausea before eating solid foods. General instructions  Take over-the-counter and prescription medicines only as told by your health care provider.  If you have sleep apnea, surgery and certain medicines can increase your risk for breathing problems. Follow instructions from your health care provider  about wearing your sleep device: ? Anytime you are sleeping, including during daytime naps. ? While taking prescription pain medicines, sleeping medicines, or medicines that make you drowsy.  If you smoke, do not smoke without supervision.  Keep all follow-up visits as told by your health care provider. This is important. Contact a health care provider if:  You keep feeling nauseous or you keep vomiting.  You feel light-headed.  You develop a rash.  You have a fever. Get help right away if:  You have trouble breathing. Summary  For several hours after your procedure, you may feel sleepy and have poor judgment.  Have a responsible adult stay with you for at least 24 hours or until you are awake and alert. This information is not intended to replace advice given to you by your health care provider. Make sure you discuss any questions you have with your health care provider. Document Released: 05/26/2015 Document Revised: 05/03/2017 Document Reviewed: 05/26/2015 Elsevier Patient Education  [place].  Hiatal Hernia  A hiatal hernia occurs when part of the stomach slides above the muscle that separates the abdomen from the chest (diaphragm). A person can be born with a hiatal hernia (congenital), or it may develop over time. In almost all cases of hiatal hernia, only the top part of the stomach pushes through the diaphragm. Many people have a hiatal hernia with no symptoms. The larger the hernia, the more likely it is that you will have symptoms. In some cases, a hiatal hernia allows stomach acid to flow back into the tube that carries food from your mouth to  your stomach (esophagus). This may cause heartburn symptoms. Severe heartburn symptoms may mean that you have developed a condition called gastroesophageal reflux disease (GERD). What are the causes? This condition is caused by a weakness in the opening (hiatus) where the esophagus passes through the diaphragm to attach to  the upper part of the stomach. A person may be born with a weakness in the hiatus, or a weakness can develop over time. What increases the risk? This condition is more likely to develop in:  Older people. Age is a major risk factor for a hiatal hernia, especially if you are over the age of 46.  Pregnant women.  People who are overweight.  People who have frequent constipation. What are the signs or symptoms? Symptoms of this condition usually develop in the form of GERD symptoms. Symptoms include:  Heartburn.  Belching.  Indigestion.  Trouble swallowing.  Coughing or wheezing.  Sore throat.  Hoarseness.  Chest pain.  Nausea and vomiting. How is this diagnosed? This condition may be diagnosed during testing for GERD. Tests that may be done include:  X-rays of your stomach or chest.  An upper gastrointestinal (GI) series. This is an X-ray exam of your GI tract that is taken after you swallow a chalky liquid that shows up clearly on the X-ray.  Endoscopy. This is a procedure to look into your stomach using a thin, flexible tube that has a tiny camera and light on the end of it. How is this treated? This condition may be treated by:  Dietary and lifestyle changes to help reduce GERD symptoms.  Medicines. These may include: ? Over-the-counter antacids. ? Medicines that make your stomach empty more quickly. ? Medicines that block the production of stomach acid (H2 blockers). ? Stronger medicines to reduce stomach acid (proton pump inhibitors).  Surgery to repair the hernia, if other treatments are not helping. If you have no symptoms, you may not need treatment. Follow these instructions at home: Lifestyle and activity  Do not use any products that contain nicotine or tobacco, such as cigarettes and e-cigarettes. If you need help quitting, ask your health care provider.  Try to achieve and maintain a healthy body weight.  Avoid putting pressure on your abdomen.  Anything that puts pressure on your abdomen increases the amount of acid that may be pushed up into your esophagus. ? Avoid bending over, especially after eating. ? Raise the head of your bed by putting blocks under the legs. This keeps your head and esophagus higher than your stomach. ? Do not wear tight clothing around your chest or stomach. ? Try not to strain when having a bowel movement, when urinating, or when lifting heavy objects. Eating and drinking  Avoid foods that can worsen GERD symptoms. These may include: ? Fatty foods, like fried foods. ? Citrus fruits, like oranges or lemon. ? Other foods and drinks that contain acid, like orange juice or tomatoes. ? Spicy food. ? Chocolate.  Eat frequent small meals instead of three large meals a day. This helps prevent your stomach from getting too full. ? Eat slowly. ? Do not lie down right after eating. ? Do not eat 1-2 hours before bed.  Do not drink beverages with caffeine. These include cola, coffee, cocoa, and tea.  Do not drink alcohol. General instructions  Take over-the-counter and prescription medicines only as told by your health care provider.  Keep all follow-up visits as told by your health care provider. This is important. Contact a health  care provider if:  Your symptoms are not controlled with medicines or lifestyle changes.  You are having trouble swallowing.  You have coughing or wheezing that will not go away. Get help right away if:  Your pain is getting worse.  Your pain spreads to your arms, neck, jaw, teeth, or back.  You have shortness of breath.  You sweat for no reason.  You feel sick to your stomach (nauseous) or you vomit.  You vomit blood.  You have bright red blood in your stools.  You have black, tarry stools. This information is not intended to replace advice given to you by your health care provider. Make sure you discuss any questions you have with your health care  provider. Document Released: 04/25/2003 Document Revised: 01/15/2017 Document Reviewed: 09/07/2016 Elsevier Patient Education  2020 [place].  Upper Endoscopy, Adult, Care After This sheet gives you information about how to care for yourself after your procedure. Your health care provider may also give you more specific instructions. If you have problems or questions, contact your health care provider. What can I expect after the procedure? After the procedure, it is common to have:  A sore throat.  Mild stomach pain or discomfort.  Bloating.  Nausea. Follow these instructions at home:   Follow instructions from your health care provider about what to eat or drink after your procedure.  Return to your normal activities as told by your health care provider. Ask your health care provider what activities are safe for you.  Take over-the-counter and prescription medicines only as told by your health care provider.  Do not drive for 24 hours if you were given a sedative during your procedure.  Keep all follow-up visits as told by your health care provider. This is important. Contact a health care provider if you have:  A sore throat that lasts longer than one day.  Trouble swallowing. Get help right away if:  You vomit blood or your vomit looks like coffee grounds.  You have: ? A fever. ? Bloody, black, or tarry stools. ? A severe sore throat or you cannot swallow. ? Difficulty breathing. ? Severe pain in your chest or abdomen. Summary  After the procedure, it is common to have a sore throat, mild stomach discomfort, bloating, and nausea.  Do not drive for 24 hours if you were given a sedative during the procedure.  Follow instructions from your health care provider about what to eat or drink after your procedure.  Return to your normal activities as told by your health care provider. This information is not intended to replace advice given to you by your health  care provider. Make sure you discuss any questions you have with your health care provider. Document Released: 08/04/2011 Document Revised: 07/27/2017 Document Reviewed: 07/05/2017 Elsevier Patient Education  2020 [employer].

## 2018-10-14 NOTE — Transfer of Care (Signed)
Immediate Anesthesia Transfer of Care Note  Patient: Mackenzie Black  Procedure(s) Performed: ESOPHAGOGASTRODUODENOSCOPY (EGD) WITH PROPOFOL (N/A ) ESOPHAGEAL DILATION (N/A )  Patient Location: PACU  Anesthesia Type:General  Level of Consciousness: awake  Airway & Oxygen Therapy: Patient Spontanous Breathing  Post-op Assessment: Report given to RN  Post vital signs: Reviewed and stable  Last Vitals:  Vitals Value Taken Time  BP 91/58 10/14/18 0900  Temp    Pulse 66 10/14/18 0901  Resp 15 10/14/18 0901  SpO2 94 % 10/14/18 0901  Vitals shown include unvalidated device data.  Last Pain:  Vitals:   10/14/18 0756  TempSrc: Oral  PainSc: 0-No pain      Patients Stated Pain Goal: 5 ([id number])  Complications: No apparent anesthesia complications

## 2018-10-14 NOTE — Anesthesia Preprocedure Evaluation (Addendum)
Anesthesia Evaluation  Patient identified by MRN, date of birth, ID band Patient awake    Reviewed: Allergy & Precautions, NPO status , Patient's Chart, lab work & pertinent test results, reviewed documented beta blocker date and time   Airway Mallampati: III  TM Distance: >3 FB     Dental  (+) Missing,    Pulmonary former smoker,    Pulmonary exam normal breath sounds clear to auscultation       Cardiovascular METS: 3 - Mets hypertension, Pt. on medications and Pt. on home beta blockers Normal cardiovascular examDysrhythmias: palpitations, on beta blockers, took meds this morning. + Valvular Problems/Murmurs   Study Conclusions   - Left ventricle: The cavity size was normal. Wall thickness was  normal. Systolic function was normal. The estimated ejection  fraction was in the range of 60% to 65%. Wall motion was normal;  there were no regional wall motion abnormalities. Left  ventricular diastolic function parameters were normal.  - Aortic valve: Mildly calcified annulus. Trileaflet.  - Mitral valve: There was trivial regurgitation.  - Right atrium: Central venous pressure (est): 3 mm Hg.  - Atrial septum: No defect or patent foramen ovale was identified.  - Tricuspid valve: There was trivial regurgitation.  - Pulmonary arteries: PA peak pressure: 24 mm Hg (S).  - Pericardium, extracardiac: There was no pericardial effusion.    Neuro/Psych  Neuromuscular disease    GI/Hepatic Neg liver ROS, GERD  Medicated,  Endo/Other  negative endocrine ROS  Renal/GU negative Renal ROS     Musculoskeletal  (+) Arthritis , Rheumatoid disorders,    Abdominal   Peds  Hematology   Anesthesia Other Findings Past Medical History: Diagnosis Date . Back pain  . GERD (gastroesophageal reflux disease)  . Heart murmur  . Hypercholesteremia  . Neuropathy  . RA (rheumatoid arthritis) ([place])  . Seasonal allergies   Past  Surgical History: Procedure Laterality Date . Bilateral foot surgery   . COLONOSCOPY  03/31/2011  Procedure: COLONOSCOPY;  Surgeon: Jamesetta So, MD;  [facility];  Service: Gastroenterology;  Laterality: N/A; . COLONOSCOPY N/A 06/12/2016  Procedure: COLONOSCOPY;  Surgeon: Rogene Houston, MD;  [facility];  Service: Endoscopy;  Laterality: N/A;  1205-moved to 1035 . ESOPHAGEAL DILATION N/A 07/12/2015  Procedure: ESOPHAGEAL DILATION;  Surgeon: Rogene Houston, MD;  [facility];  Service: Endoscopy;  Laterality: N/A; . ESOPHAGOGASTRODUODENOSCOPY N/A 07/12/2015  Procedure: ESOPHAGOGASTRODUODENOSCOPY (EGD);  Surgeon: Rogene Houston, MD;  [facility];  Service: Endoscopy;  Laterality: N/A;  250-moved to 1245 Ann to notify pt . excision of cyst of hand   . KNEE SURGERY   . TONSILLECTOMY   . TUBAL LIGATION       Reproductive/Obstetrics                           Anesthesia Physical Anesthesia Plan  ASA: II  Anesthesia Plan: General   Post-op Pain Management:    Induction:   PONV Risk Score and Plan: 1  Airway Management Planned: Simple Face Mask and Nasal Cannula  Additional Equipment:   Intra-op Plan:   Post-operative Plan:   Informed Consent: I have reviewed the patients History and Physical, chart, labs and discussed the procedure including the risks, benefits and alternatives for the proposed anesthesia with the patient or authorized representative who has indicated his/her understanding and acceptance.     Dental advisory given  Plan Discussed with: CRNA  Anesthesia Plan Comments:        Anesthesia Quick Evaluation

## 2018-10-18 ENCOUNTER — Other Ambulatory Visit (HOSPITAL_COMMUNITY): Payer: Self-pay | Admitting: Family Medicine

## 2018-10-18 ENCOUNTER — Encounter (HOSPITAL_COMMUNITY): Payer: Self-pay | Admitting: Internal Medicine

## 2018-10-18 DIAGNOSIS — Z1231 Encounter for screening mammogram for malignant neoplasm of breast: Secondary | ICD-10-CM

## 2018-10-19 ENCOUNTER — Telehealth (INDEPENDENT_AMBULATORY_CARE_PROVIDER_SITE_OTHER): Payer: Self-pay | Admitting: *Deleted

## 2018-10-19 NOTE — Telephone Encounter (Signed)
Patient was ask to call our office today, 1 week after her procedure. She says that the first few days her throat was sore , but now it is okay , no issues with her throat.  Dr.Rehman to be made aware.

## 2018-11-04 ENCOUNTER — Other Ambulatory Visit: Payer: Self-pay | Admitting: Cardiology

## 2018-11-21 ENCOUNTER — Ambulatory Visit (HOSPITAL_COMMUNITY): Payer: Medicare Other

## 2018-12-02 ENCOUNTER — Ambulatory Visit (HOSPITAL_COMMUNITY): Payer: Medicare Other

## 2018-12-15 ENCOUNTER — Ambulatory Visit (HOSPITAL_COMMUNITY): Payer: Medicare Other

## 2018-12-21 ENCOUNTER — Ambulatory Visit (HOSPITAL_COMMUNITY)
Admission: RE | Admit: 2018-12-21 | Discharge: 2018-12-21 | Disposition: A | Discharge status: Home / Self Care | Payer: Medicare Other | Source: Ambulatory Visit | Attending: Family Medicine | Admitting: Family Medicine

## 2018-12-21 ENCOUNTER — Other Ambulatory Visit: Payer: Self-pay

## 2018-12-21 DIAGNOSIS — Z1231 Encounter for screening mammogram for malignant neoplasm of breast: Secondary | ICD-10-CM | POA: Diagnosis not present

## 2018-12-27 ENCOUNTER — Other Ambulatory Visit (INDEPENDENT_AMBULATORY_CARE_PROVIDER_SITE_OTHER): Payer: Self-pay | Admitting: Internal Medicine

## 2019-04-05 ENCOUNTER — Other Ambulatory Visit (HOSPITAL_COMMUNITY): Payer: Self-pay | Admitting: Family Medicine

## 2019-04-05 ENCOUNTER — Other Ambulatory Visit: Payer: Self-pay | Admitting: Family Medicine

## 2019-04-05 DIAGNOSIS — I6521 Occlusion and stenosis of right carotid artery: Secondary | ICD-10-CM

## 2019-04-05 DIAGNOSIS — Z780 Asymptomatic menopausal state: Secondary | ICD-10-CM

## 2019-04-12 ENCOUNTER — Ambulatory Visit (HOSPITAL_COMMUNITY)
Admission: RE | Admit: 2019-04-12 | Discharge: 2019-04-12 | Disposition: A | Discharge status: Home / Self Care | Payer: Medicare Other | Source: Ambulatory Visit | Attending: Family Medicine | Admitting: Family Medicine

## 2019-04-12 ENCOUNTER — Other Ambulatory Visit: Payer: Self-pay

## 2019-04-12 DIAGNOSIS — Z780 Asymptomatic menopausal state: Secondary | ICD-10-CM

## 2019-04-12 DIAGNOSIS — I6521 Occlusion and stenosis of right carotid artery: Secondary | ICD-10-CM | POA: Diagnosis not present

## 2019-06-24 ENCOUNTER — Other Ambulatory Visit (INDEPENDENT_AMBULATORY_CARE_PROVIDER_SITE_OTHER): Payer: Self-pay | Admitting: Internal Medicine

## 2019-09-05 ENCOUNTER — Ambulatory Visit (INDEPENDENT_AMBULATORY_CARE_PROVIDER_SITE_OTHER): Payer: Medicare Other | Admitting: Internal Medicine

## 2019-10-19 ENCOUNTER — Other Ambulatory Visit: Payer: Self-pay

## 2019-10-19 ENCOUNTER — Ambulatory Visit (INDEPENDENT_AMBULATORY_CARE_PROVIDER_SITE_OTHER): Payer: Medicare Other | Admitting: Gastroenterology

## 2019-10-19 ENCOUNTER — Encounter (INDEPENDENT_AMBULATORY_CARE_PROVIDER_SITE_OTHER): Payer: Self-pay | Admitting: Gastroenterology

## 2019-10-19 VITALS — BP 115/76 | HR 56 | Temp 98.9°F | Ht 67.0 in | Wt 172.3 lb

## 2019-10-19 DIAGNOSIS — K219 Gastro-esophageal reflux disease without esophagitis: Secondary | ICD-10-CM

## 2019-10-19 DIAGNOSIS — Z86010 Personal history of colonic polyps: Secondary | ICD-10-CM | POA: Diagnosis not present

## 2019-10-19 DIAGNOSIS — I6521 Occlusion and stenosis of right carotid artery: Secondary | ICD-10-CM

## 2019-10-19 MED ORDER — LANSOPRAZOLE 30 MG PO CPDR: DELAYED_RELEASE_CAPSULE | ORAL | 3 refills | Status: DC

## 2019-10-19 NOTE — Progress Notes (Signed)
Patient profile: Mackenzie Black is a 64 y.o. female seen for f/up of GERD. Last seen 08/2018.  History of Present Illness: Mackenzie Black is seen today for f/up. Last seen August 2020 as below.  She reports her dysphagia at this point is relatively infrequent, occurs on 3-4 times a year. When occurs food will stop in midesophagus, mainly happens w/ meat & bread. She is able to pass substance without regurgitating. She denies any frequent GERD symptoms on her Prevacid.  No epigastric pain.  She has lost some weight due to eating smaller portions and eating less fatty foods recently.  She denies any bowel habit changes.  Having a bowel movement average of 5 times a week.  Denies any blood in stool melena or abdominal pain.  Wt Readings from Last 3 Encounters:  10/19/19 172 lb 4.8 oz (78.2 kg)  10/14/18 178 lb (80.7 kg)  09/06/18 178 lb 1.6 oz (80.8 kg)     Last Colonoscopy: 05/2016- - Diverticulosis in the sigmoid colon.  - Anal papilla(e) were hypertrophied.  - No specimens collected.   Last Endoscopy: 09/2018- Esophageal mucosal changes secondary to established Barrett's disease. Single small patch. - Low-grade of narrowing Schatzki ring. Dilated with balloon. Could not pass Maloney past hypopharynx,  - 4 cm hiatal hernia. - Normal stomach. - Normal duodenal bulb and second portion of the duodenum. - No specimens collected.   Past Medical History:  Past Medical History:  Diagnosis Date  . Back pain   . GERD (gastroesophageal reflux disease)   . Heart murmur   . Hypercholesteremia   . Neuropathy   . RA (rheumatoid arthritis) ([place])   . Seasonal allergies     Problem List: Patient Active Problem List   Diagnosis Date Noted  . Barrett's esophagus with dysplasia 09/07/2018  . Gastroesophageal reflux disease 09/07/2018  . GERD (gastroesophageal reflux disease) 09/06/2018  . SSBE (short-segment Barrett's esophagus) 09/06/2018  . Esophageal dysphagia 09/06/2018  . Family  history of colon cancer 05/04/2016  . Transaminasemia 08/29/2012  . Elevated liver enzymes 06/27/2012  . Small fiber neuropathy 06/14/2012  . Difficulty in walking(719.7) 10/28/2011  . Stiffness of joint, not elsewhere classified, ankle and foot 10/28/2011  . Weakness of both legs 10/28/2011  . Lumbar spondylosis 06/15/2011  . Back pain 06/15/2011  . HYPERTENSION 09/14/2008  . SUPRAVENTRICULAR TACHYCARDIA 09/14/2008    Past Surgical History: Past Surgical History:  Procedure Laterality Date  . Bilateral foot surgery    . COLONOSCOPY  03/31/2011   Procedure: COLONOSCOPY;  Surgeon: Jamesetta So, MD;  [facility];  Service: Gastroenterology;  Laterality: N/A;  . COLONOSCOPY N/A 06/12/2016   Procedure: COLONOSCOPY;  Surgeon: Rogene Houston, MD;  [facility];  Service: Endoscopy;  Laterality: N/A;  1205-moved to 1035  . ESOPHAGEAL DILATION N/A 07/12/2015   Procedure: ESOPHAGEAL DILATION;  Surgeon: Rogene Houston, MD;  [facility];  Service: Endoscopy;  Laterality: N/A;  . ESOPHAGEAL DILATION N/A 10/14/2018   Procedure: ESOPHAGEAL DILATION;  Surgeon: Rogene Houston, MD;  [facility];  Service: Endoscopy;  Laterality: N/A;  . ESOPHAGOGASTRODUODENOSCOPY N/A 07/12/2015   Procedure: ESOPHAGOGASTRODUODENOSCOPY (EGD);  Surgeon: Rogene Houston, MD;  [facility];  Service: Endoscopy;  Laterality: N/A;  250-moved to 1245 Ann to notify pt  . ESOPHAGOGASTRODUODENOSCOPY (EGD) WITH PROPOFOL N/A 10/14/2018   Procedure: ESOPHAGOGASTRODUODENOSCOPY (EGD) WITH PROPOFOL;  Surgeon: Rogene Houston, MD;  [facility];  Service:  Endoscopy;  Laterality: N/A;  855  . excision of cyst of hand    . KNEE SURGERY    . TONSILLECTOMY    . TUBAL LIGATION      Allergies: Allergies  Allergen Reactions  . Humira [Adalimumab]     rash      Home Medications:  Current Outpatient Medications:  .  alendronate (FOSAMAX) 70 MG tablet, Take 70 mg  by mouth once a week., Disp: , Rfl:  .  aspirin EC 81 MG tablet, Take 81 mg by mouth daily., Disp: , Rfl:  .  Aspirin-Caffeine (BC FAST PAIN RELIEF) 845-65 MG PACK, Take 1 Package by mouth daily as needed (headache). Patient states that she takes a BC 1 to 2 times a week for headache, Disp: 56 each, Rfl:  .  atorvastatin (LIPITOR) 20 MG tablet, Take 10 mg by mouth daily at 6 PM. , Disp: , Rfl:  .  Cyanocobalamin (B-12) 1000 MCG/ML KIT, Inject as directed every 30 (thirty) days., Disp: , Rfl:  .  gabapentin (NEURONTIN) 300 MG capsule, Take 300 mg by mouth 2 (two) times daily. , Disp: , Rfl:  .  hydroxychloroquine (PLAQUENIL) 200 MG tablet, Take 200 mg by mouth 2 (two) times daily. Reported on 03/12/2015, Disp: , Rfl:  .  lansoprazole (PREVACID) 30 MG capsule, Take one capsule daily prior to breakfast, Disp: 90 capsule, Rfl: 3 .  metoprolol tartrate (LOPRESSOR) 25 MG tablet, Take 1 tablet (25 mg total) by mouth 2 (two) times daily., Disp: 180 tablet, Rfl: 3 .  PRESCRIPTION MEDICATION, Patient is getting 2 cc of .05% Marcaine 1 cc 1000 mcg B12 By Dr. Berline Lopes once a month. Foot Block, Disp: , Rfl:  .  RESTASIS MULTIDOSE 0.05 % ophthalmic emulsion, Place 1 drop into both eyes 2 (two) times daily. , Disp: , Rfl:    Family History: family history includes Cancer - Lung in her father; Colon cancer in her brother and sister; Stroke in her mother.    Social History:   reports that she has quit smoking. Her smoking use included cigarettes. She has a 0.50 pack-year smoking history. She has never used smokeless tobacco. She reports that she does not drink alcohol and does not use drugs.   Review of Systems: Constitutional: Denies weight loss/weight gain  Eyes: No changes in vision. ENT: No oral lesions, sore throat.  GI: see HPI.  Heme/Lymph: No easy bruising.  CV: No chest pain.  GU: No hematuria.  Integumentary: No rashes.  Neuro: No headaches.  Psych: No depression/anxiety.  Endocrine: No heat/cold  intolerance.  Allergic/Immunologic: No urticaria.  Resp: No cough, SOB.  Musculoskeletal: No joint swelling.    Physical Examination: BP 115/76 (BP Location: Left Arm, Patient Position: Sitting, Cuff Size: Normal)   Pulse (!) 56   Temp 98.9 F (37.2 C) (Oral)   Ht 5' 7" (1.702 m)   Wt 172 lb 4.8 oz (78.2 kg)   BMI 26.99 kg/m  Gen: NAD, alert and oriented x 4 HEENT: PEERLA, EOMI, Neck: supple, no JVD Chest: CTA bilaterally, no wheezes, crackles, or other adventitious sounds CV: RRR, no m/g/c/r Abd: soft, NT, ND, +BS in all four quadrants; no HSM, guarding, ridigity, or rebound tenderness Ext: no edema, well perfused with 2+ pulses, Skin: no rash or lesions noted on observed skin Lymph: no noted LAD  Data Reviewed:  Labs monitored by PCP-per patient normal.  Not available in epic or Care Everywhere.   Assessment/Plan: Ms. Pyeatt is a 64 y.o.  female   1.  GERD-continue Prevacid.  Prior to last week she was taking Fosamax laying flat, she is now remaining upright after Fosamax.  Reviewed other GERD diet modifications.  Continue PPI.  To notify me if dysphagia becomes more frequent  2.  History of polyps-due 2023. No current LGI sx.   Follow-up 1 year-sooner if needed  Christiane was seen today for follow-up.  Diagnoses and all orders for this visit:  Chronic GERD  Personal history of colonic polyps  Other orders -     lansoprazole (PREVACID) 30 MG capsule; Take one capsule daily prior to breakfast      I personally performed the service, non-incident to. (WP)  Laurine Blazer, [facility]

## 2019-10-19 NOTE — Patient Instructions (Addendum)
Continue Prevacid as prescribed-refill sent to pharmacy.  Important to remain upright after Fosamax.  Avoid a lot of ibuprofen, Aleve, aspirin, Goody's, BCs, etc.  Notify us with any new symptoms, otherwise follow-up 1 year.

## 2019-10-19 NOTE — Progress Notes (Signed)
Follow

## 2019-11-15 ENCOUNTER — Other Ambulatory Visit (HOSPITAL_COMMUNITY): Payer: Self-pay | Admitting: Family Medicine

## 2019-11-15 DIAGNOSIS — Z1231 Encounter for screening mammogram for malignant neoplasm of breast: Secondary | ICD-10-CM

## 2019-11-28 ENCOUNTER — Other Ambulatory Visit: Payer: Self-pay | Admitting: Cardiology

## 2019-12-25 ENCOUNTER — Ambulatory Visit (HOSPITAL_COMMUNITY): Payer: Medicare Other

## 2020-01-08 ENCOUNTER — Ambulatory Visit (HOSPITAL_COMMUNITY): Payer: Medicare Other

## 2020-01-10 ENCOUNTER — Ambulatory Visit (HOSPITAL_COMMUNITY)
Admission: RE | Admit: 2020-01-10 | Discharge: 2020-01-10 | Disposition: A | Discharge status: Home / Self Care | Payer: Medicare Other | Source: Ambulatory Visit | Attending: Family Medicine | Admitting: Family Medicine

## 2020-01-10 ENCOUNTER — Other Ambulatory Visit: Payer: Self-pay

## 2020-01-10 DIAGNOSIS — Z1231 Encounter for screening mammogram for malignant neoplasm of breast: Secondary | ICD-10-CM

## 2020-01-16 ENCOUNTER — Other Ambulatory Visit (HOSPITAL_COMMUNITY): Payer: Self-pay | Admitting: Family Medicine

## 2020-01-16 DIAGNOSIS — R928 Other abnormal and inconclusive findings on diagnostic imaging of breast: Secondary | ICD-10-CM

## 2020-01-19 ENCOUNTER — Ambulatory Visit (HOSPITAL_COMMUNITY): Payer: Medicare Other

## 2020-01-19 ENCOUNTER — Encounter (HOSPITAL_COMMUNITY): Payer: Medicare Other

## 2020-01-29 ENCOUNTER — Other Ambulatory Visit: Payer: Self-pay | Admitting: Cardiology

## 2020-01-30 ENCOUNTER — Ambulatory Visit (HOSPITAL_COMMUNITY)
Admission: RE | Admit: 2020-01-30 | Discharge: 2020-01-30 | Disposition: A | Discharge status: Home / Self Care | Payer: Medicare Other | Source: Ambulatory Visit | Attending: Family Medicine | Admitting: Family Medicine

## 2020-01-30 ENCOUNTER — Other Ambulatory Visit: Payer: Self-pay

## 2020-01-30 DIAGNOSIS — R928 Other abnormal and inconclusive findings on diagnostic imaging of breast: Secondary | ICD-10-CM

## 2020-01-31 ENCOUNTER — Other Ambulatory Visit: Payer: Self-pay | Admitting: Cardiology

## 2020-02-20 ENCOUNTER — Other Ambulatory Visit (HOSPITAL_COMMUNITY): Payer: Medicare Other

## 2020-02-20 ENCOUNTER — Encounter (HOSPITAL_COMMUNITY): Payer: Medicare Other

## 2020-03-30 ENCOUNTER — Other Ambulatory Visit: Payer: Self-pay | Admitting: Cardiology

## 2020-04-02 ENCOUNTER — Telehealth: Payer: Self-pay | Admitting: Cardiology

## 2020-04-02 MED ORDER — METOPROLOL TARTRATE 25 MG PO TABS: ORAL_TABLET | ORAL | 0 refills | Status: DC

## 2020-04-02 NOTE — Telephone Encounter (Signed)
New message      *STAT* If patient is at the pharmacy, call can be transferred to refill team.   1. Which medications need to be refilled? (please list name of each medication and dose if known) metoprolol tartrate (LOPRESSOR) 25 MG tablet  2. Which pharmacy/location (including street and city if local pharmacy) is medication to be sent to? [place]  3. Do they need a 30 day or 90 day supply? [place]

## 2020-04-02 NOTE — Telephone Encounter (Signed)
Medication refill request approved for 15 days. Patient needs a F/U appt with Cardiologist.

## 2020-04-17 ENCOUNTER — Ambulatory Visit: Payer: Medicare Other | Admitting: Physician Assistant

## 2020-05-09 ENCOUNTER — Ambulatory Visit: Payer: Medicare Other | Admitting: Cardiology

## 2020-08-29 ENCOUNTER — Other Ambulatory Visit: Payer: Self-pay

## 2020-08-29 ENCOUNTER — Encounter: Payer: Self-pay | Admitting: Cardiology

## 2020-08-29 ENCOUNTER — Ambulatory Visit (INDEPENDENT_AMBULATORY_CARE_PROVIDER_SITE_OTHER): Payer: Medicare Other | Admitting: Cardiology

## 2020-08-29 VITALS — BP 144/86 | HR 68 | Ht 65.0 in | Wt 175.0 lb

## 2020-08-29 DIAGNOSIS — R002 Palpitations: Secondary | ICD-10-CM | POA: Diagnosis not present

## 2020-08-29 MED ORDER — METOPROLOL TARTRATE 25 MG PO TABS: ORAL_TABLET | ORAL | 3 refills | Status: DC

## 2020-08-29 NOTE — Patient Instructions (Signed)
Medication Instructions:  Your physician recommends that you continue on your current medications as directed. Please refer to the Current Medication list given to you today.  *If you need a refill on your cardiac medications before your next appointment, please call your pharmacy*   Lab Work: We have requested your most recent lab work from your primary care provider If you have labs (blood work) drawn today and your tests are completely normal, you will receive your results only by: [place] (if you have MyChart) OR A paper copy in the mail If you have any lab test that is abnormal or we need to change your treatment, we will call you to review the results.   Testing/Procedures: None   Follow-Up: At [facility], you and your health needs are our priority.  As part of our continuing mission to provide you with exceptional heart care, we have created designated Provider Care Teams.  These Care Teams include your primary Cardiologist (physician) and Advanced Practice Providers (APPs -  Physician Assistants and Nurse Practitioners) who all work together to provide you with the care you need, when you need it.  We recommend signing up for the patient portal called "MyChart".  Sign up information is provided on this After Visit Summary.  MyChart is used to connect with patients for Virtual Visits (Telemedicine).  Patients are able to view lab/test results, encounter notes, upcoming appointments, etc.  Non-urgent messages can be sent to your provider as well.   To learn more about what you can do with MyChart, go to [email or web address].    Your next appointment:   1 year(s)  The format for your next appointment:   In Person  Provider:   Carlyle Dolly, MD   Other Instructions

## 2020-08-29 NOTE — Progress Notes (Signed)
Clinical Summary Ms. Reindl is a 65 y.o.female seen today for follow up of the following medical problems.      1. Palpitations - off and on several years - feelilng of heart jumping. More often occurs with laying down or at rest -several episodes during a week. Variable in duration. No other associated symptoms. - no EtoH. No coffee. Occasoinal tea. Drinks pretty frequent sodas, trying to change to caffeine free. Drinks hot [employer]. Takes bc powders that have caffeine.  - she reports a strong family history of afib.      05/2017 7 day event monitor no arrhythmias    - fluttering at times, infrequent. Lasts just a few seconds.   Past Medical History:  Diagnosis Date   Back pain    GERD (gastroesophageal reflux disease)    Heart murmur    Hypercholesteremia    Neuropathy    RA (rheumatoid arthritis) ([facility])    Seasonal allergies      Allergies  Allergen Reactions   Humira [Adalimumab]     rash     Current Outpatient Medications  Medication Sig Dispense Refill   alendronate (FOSAMAX) 70 MG tablet Take 70 mg by mouth once a week.     aspirin EC 81 MG tablet Take 81 mg by mouth daily.     Aspirin-Caffeine (BC FAST PAIN RELIEF) 845-65 MG PACK Take 1 Package by mouth daily as needed (headache). Patient states that she takes a BC 1 to 2 times a week for headache 56 each    atorvastatin (LIPITOR) 20 MG tablet Take 10 mg by mouth daily at 6 PM.      Cyanocobalamin (B-12) 1000 MCG/ML KIT Inject as directed every 30 (thirty) days.     gabapentin (NEURONTIN) 300 MG capsule Take 300 mg by mouth 2 (two) times daily.      hydroxychloroquine (PLAQUENIL) 200 MG tablet Take 200 mg by mouth 2 (two) times daily. Reported on 03/12/2015     lansoprazole (PREVACID) 30 MG capsule Take one capsule daily prior to breakfast 90 capsule 3   metoprolol tartrate (LOPRESSOR) 25 MG tablet TAKE (1) TABLET BY MOUTH TWICE DAILY. 30 tablet 0   PRESCRIPTION MEDICATION Patient is getting 2 cc of  .05% Marcaine 1 cc 1000 mcg B12 By Dr. Berline Lopes once a month. Foot Block     RESTASIS MULTIDOSE 0.05 % ophthalmic emulsion Place 1 drop into both eyes 2 (two) times daily.      No current facility-administered medications for this visit.     Past Surgical History:  Procedure Laterality Date   Bilateral foot surgery     COLONOSCOPY  03/31/2011   Procedure: COLONOSCOPY;  Surgeon: Jamesetta So, MD;  [facility];  Service: Gastroenterology;  Laterality: N/A;   COLONOSCOPY N/A 06/12/2016   Procedure: COLONOSCOPY;  Surgeon: Rogene Houston, MD;  [facility];  Service: Endoscopy;  Laterality: N/A;  1205-moved to 1035   ESOPHAGEAL DILATION N/A 07/12/2015   Procedure: ESOPHAGEAL DILATION;  Surgeon: Rogene Houston, MD;  [facility];  Service: Endoscopy;  Laterality: N/A;   ESOPHAGEAL DILATION N/A 10/14/2018   Procedure: ESOPHAGEAL DILATION;  Surgeon: Rogene Houston, MD;  [facility];  Service: Endoscopy;  Laterality: N/A;   ESOPHAGOGASTRODUODENOSCOPY N/A 07/12/2015   Procedure: ESOPHAGOGASTRODUODENOSCOPY (EGD);  Surgeon: Rogene Houston, MD;  [facility];  Service: Endoscopy;  Laterality: N/A;  250-moved to 1245 Ann to notify pt  ESOPHAGOGASTRODUODENOSCOPY (EGD) WITH PROPOFOL N/A 10/14/2018   Procedure: ESOPHAGOGASTRODUODENOSCOPY (EGD) WITH PROPOFOL;  Surgeon: Rogene Houston, MD;  [facility];  Service: Endoscopy;  Laterality: N/A;  855   excision of cyst of hand     KNEE SURGERY     TONSILLECTOMY     TUBAL LIGATION       Allergies  Allergen Reactions   Humira [Adalimumab]     rash      Family History  Problem Relation Age of Onset   Colon cancer Sister    Colon cancer Brother    Stroke Mother    Cancer - Lung Father      Social History Ms. Lienemann reports that she has quit smoking. Her smoking use included cigarettes. She has a 0.50 pack-year smoking history. She has never used smokeless tobacco. Ms.  Boehm reports no history of alcohol use.   Review of Systems CONSTITUTIONAL: No weight loss, fever, chills, weakness or fatigue.  HEENT: Eyes: No visual loss, blurred vision, double vision or yellow sclerae.No hearing loss, sneezing, congestion, runny nose or sore throat.  SKIN: No rash or itching.  CARDIOVASCULAR: per hpi RESPIRATORY: No shortness of breath, cough or sputum.  GASTROINTESTINAL: No anorexia, nausea, vomiting or diarrhea. No abdominal pain or blood.  GENITOURINARY: No burning on urination, no polyuria NEUROLOGICAL: No headache, dizziness, syncope, paralysis, ataxia, numbness or tingling in the extremities. No change in bowel or bladder control.  MUSCULOSKELETAL: No muscle, back pain, joint pain or stiffness.  LYMPHATICS: No enlarged nodes. No history of splenectomy.  PSYCHIATRIC: No history of depression or anxiety.  ENDOCRINOLOGIC: No reports of sweating, cold or heat intolerance. No polyuria or polydipsia.  Marland Kitchen   Physical Examination Today's Vitals   08/29/20 1352  BP: (!) 144/86  Pulse: 68  SpO2: 96%  Weight: 175 lb (79.4 kg)  Height: _0  (1.651 m)   Body mass index is 29.12 kg/m.  Gen: resting comfortably, no acute distress HEENT: no scleral icterus, pupils equal round and reactive, no palptable cervical adenopathy,  CV: RRR, no mr/g, no jvd Resp: Clear to auscultation bilaterally GI: abdomen is soft, non-tender, non-distended, normal bowel sounds, no hepatosplenomegaly MSK: extremities are warm, no edema.  Skin: warm, no rash Neuro:  no focal deficits Psych: appropriate affect   Diagnostic Studies 03/2019 carotid US IMPRESSION: 1. Minimal atherosclerotic plaque involving the bilateral carotid system resulting in less than 50% stenosis bilaterally. The appearance is similar to study from 2019. 2. Antegrade flow is noted within both vertebral arteries.  05/2017 holter 7 day event monitor Min HR 52, Max HR 117, Avg HR 73 No symptoms  reported Available tracings show normal sinus rhythm No significant arrhythmias.   05/2017 echo Study Conclusions   - Left ventricle: The cavity size was normal. Wall thickness was    normal. Systolic function was normal. The estimated ejection    fraction was in the range of 60% to 65%. Wall motion was normal;    there were no regional wall motion abnormalities. Left    ventricular diastolic function parameters were normal.  - Aortic valve: Mildly calcified annulus. Trileaflet.  - Mitral valve: There was trivial regurgitation.  - Right atrium: Central venous pressure (est): 3 mm Hg.  - Atrial septum: No defect or patent foramen ovale was identified.  - Tricuspid valve: There was trivial regurgitation.  - Pulmonary arteries: PA peak pressure: 24 mm Hg (S).  - Pericardium, extracardiac: There was no pericardial effusion.    Assessment  and Plan   1. Palpitations - benign cardiac monitor - mild infrequent symptoms, we will continue lopressor.  EKG today shows NSR     Arnoldo Lenis, M.D.

## 2020-10-24 ENCOUNTER — Ambulatory Visit (INDEPENDENT_AMBULATORY_CARE_PROVIDER_SITE_OTHER): Payer: Medicare Other | Admitting: Gastroenterology

## 2020-11-12 ENCOUNTER — Telehealth (INDEPENDENT_AMBULATORY_CARE_PROVIDER_SITE_OTHER): Payer: Self-pay | Admitting: *Deleted

## 2020-11-12 ENCOUNTER — Other Ambulatory Visit (INDEPENDENT_AMBULATORY_CARE_PROVIDER_SITE_OTHER): Payer: Self-pay | Admitting: Gastroenterology

## 2020-11-12 MED ORDER — LANSOPRAZOLE 30 MG PO CPDR: DELAYED_RELEASE_CAPSULE | ORAL | 3 refills | Status: DC

## 2020-11-12 NOTE — Telephone Encounter (Signed)
Pt took last lansoprazole yesterday. Would like a refill sent to [facility]. Last ov was 10/19/19 with Thayer Headings and does have an upcoming appt 02/03/21 with dr Jenetta Downer.   [phone]

## 2020-11-12 NOTE — Telephone Encounter (Signed)
Pt notified refill sent

## 2020-11-18 ENCOUNTER — Other Ambulatory Visit: Payer: Self-pay | Admitting: Nurse Practitioner

## 2020-11-18 ENCOUNTER — Other Ambulatory Visit (HOSPITAL_COMMUNITY): Payer: Self-pay | Admitting: Nurse Practitioner

## 2020-11-18 DIAGNOSIS — I6521 Occlusion and stenosis of right carotid artery: Secondary | ICD-10-CM

## 2020-11-25 ENCOUNTER — Encounter (HOSPITAL_COMMUNITY): Payer: Self-pay

## 2020-11-25 ENCOUNTER — Ambulatory Visit (HOSPITAL_COMMUNITY): Payer: Medicare Other

## 2020-12-20 ENCOUNTER — Other Ambulatory Visit (HOSPITAL_COMMUNITY): Payer: Self-pay | Admitting: Family Medicine

## 2020-12-20 DIAGNOSIS — Z1231 Encounter for screening mammogram for malignant neoplasm of breast: Secondary | ICD-10-CM

## 2021-01-15 ENCOUNTER — Ambulatory Visit (HOSPITAL_COMMUNITY): Payer: Medicare Other

## 2021-01-16 ENCOUNTER — Other Ambulatory Visit: Payer: Self-pay

## 2021-01-16 ENCOUNTER — Ambulatory Visit (HOSPITAL_COMMUNITY)
Admission: RE | Admit: 2021-01-16 | Discharge: 2021-01-16 | Disposition: A | Discharge status: Home / Self Care | Payer: Medicare Other | Source: Ambulatory Visit | Attending: Family Medicine | Admitting: Family Medicine

## 2021-01-16 DIAGNOSIS — Z1231 Encounter for screening mammogram for malignant neoplasm of breast: Secondary | ICD-10-CM | POA: Diagnosis present

## 2021-02-03 ENCOUNTER — Ambulatory Visit (INDEPENDENT_AMBULATORY_CARE_PROVIDER_SITE_OTHER): Payer: Medicare Other | Admitting: Gastroenterology

## 2021-04-28 ENCOUNTER — Encounter (INDEPENDENT_AMBULATORY_CARE_PROVIDER_SITE_OTHER): Payer: Self-pay | Admitting: Gastroenterology

## 2021-04-28 ENCOUNTER — Ambulatory Visit (INDEPENDENT_AMBULATORY_CARE_PROVIDER_SITE_OTHER): Payer: Medicare Other | Admitting: Gastroenterology

## 2021-04-28 ENCOUNTER — Other Ambulatory Visit: Payer: Self-pay

## 2021-04-28 VITALS — BP 133/68 | HR 64 | Temp 97.9°F | Ht 65.0 in | Wt 167.1 lb

## 2021-04-28 DIAGNOSIS — K22719 Barrett's esophagus with dysplasia, unspecified: Secondary | ICD-10-CM

## 2021-04-28 DIAGNOSIS — Z800 Family history of malignant neoplasm of digestive organs: Secondary | ICD-10-CM | POA: Diagnosis not present

## 2021-04-28 DIAGNOSIS — K2270 Barrett's esophagus without dysplasia: Secondary | ICD-10-CM

## 2021-04-28 DIAGNOSIS — K219 Gastro-esophageal reflux disease without esophagitis: Secondary | ICD-10-CM | POA: Diagnosis not present

## 2021-04-28 MED ORDER — LANSOPRAZOLE 30 MG PO CPDR: DELAYED_RELEASE_CAPSULE | ORAL | 3 refills | Status: DC

## 2021-04-28 NOTE — Progress Notes (Signed)
Mackenzie Black, M.D. ?Gastroenterology & Hepatology ?[facility] ?[address] ?[place], [place] [address] ? ?Primary Care Physician: ?Lemmie Evens, MD ?[address] ?[place] [address] ? ?I will communicate my assessment and recommendations to the referring MD via EMR. ? ?Problems: ?GERD ? ?History of Present Illness: ?HARLEI LEHRMANN is a 66 y.o. female with past medical history of GERD complicated by short segment Barrett's esophagus, Schatzki's ring, hyperlipidemia, rheumatoid arthritis, who presents for follow up of GERD. ? ?The patient was last seen on 10/19/2019. At that time, the patient was continued on Prevacid 30 mg daily. ? ?Patient states feeling well and denies having any complaints. States that very infrequently she has mild dysphagia when having dry food such as bread but this is not present frequently. No odynophagia or heartburn. Takes Prevacid 30 mg 45-60 minutes before breakfast. When she gets constipated she can pass a small amount of blood but it is not frequent. The patient denies having any nausea, vomiting, fever, chills, hematochezia, melena, hematemesis, abdominal distention, abdominal pain, diarrhea, jaundice, pruritus or weight loss. ? ?Last EGD: 10/14/2018, there were changes consistent with Barrett's esophagus in the distal esophagus (not biopsied), low-grade Schatzki ring, Venia Minks was attempted to be passed but it could not be advanced, a through-the-scope dilator was insufflated up to 19 mmHg, there was presence of 4 cm hiatal hernia, normal stomach and duodenum. ?Last Colonoscopy: 06/12/2016, diverticulosis ? ?Past Medical History: ?Past Medical History:  ?Diagnosis Date  ? Back pain   ? GERD (gastroesophageal reflux disease)   ? Heart murmur   ? Hypercholesteremia   ? Neuropathy   ? RA (rheumatoid arthritis) ([facility])   ? Seasonal allergies   ? ? ?Past Surgical History: ?Past Surgical History:  ?Procedure Laterality Date  ?  Bilateral foot surgery    ? COLONOSCOPY  03/31/2011  ? Procedure: COLONOSCOPY;  Surgeon: Jamesetta So, MD;  [facility];  Service: Gastroenterology;  Laterality: N/A;  ? COLONOSCOPY N/A 06/12/2016  ? Procedure: COLONOSCOPY;  Surgeon: Rogene Houston, MD;  [facility];  Service: Endoscopy;  Laterality: N/A;  1205-moved to 1035  ? ESOPHAGEAL DILATION N/A 07/12/2015  ? Procedure: ESOPHAGEAL DILATION;  Surgeon: Rogene Houston, MD;  [facility];  Service: Endoscopy;  Laterality: N/A;  ? ESOPHAGEAL DILATION N/A 10/14/2018  ? Procedure: ESOPHAGEAL DILATION;  Surgeon: Rogene Houston, MD;  [facility];  Service: Endoscopy;  Laterality: N/A;  ? ESOPHAGOGASTRODUODENOSCOPY N/A 07/12/2015  ? Procedure: ESOPHAGOGASTRODUODENOSCOPY (EGD);  Surgeon: Rogene Houston, MD;  [facility];  Service: Endoscopy;  Laterality: N/A;  250-moved to 1245 Ann to notify pt  ? ESOPHAGOGASTRODUODENOSCOPY (EGD) WITH PROPOFOL N/A 10/14/2018  ? Procedure: ESOPHAGOGASTRODUODENOSCOPY (EGD) WITH PROPOFOL;  Surgeon: Rogene Houston, MD;  [facility];  Service: Endoscopy;  Laterality: N/A;  855  ? excision of cyst of hand    ? KNEE SURGERY    ? TONSILLECTOMY    ? TUBAL LIGATION    ? ? ?Family History: ?Family History  ?Problem Relation Age of Onset  ? Colon cancer Sister   ? Colon cancer Brother   ? Stroke Mother   ? Cancer - Lung Father   ? ? ?Social History: ?Social History  ? ?Tobacco Use  ?Smoking Status Former  ? Packs/day: 0.25  ? Years: 2.00  ? Pack years: 0.50  ? Types: Cigarettes  ?Smokeless Tobacco Never  ? ?Social History  ? ?  Substance and Sexual Activity  ?Alcohol Use No  ? Alcohol/week: 0.0 standard drinks  ? ?Social History  ? ?Substance and Sexual Activity  ?Drug Use No  ? ? ?Allergies: ?Allergies  ?Allergen Reactions  ? Humira [Adalimumab]   ?  rash  ? ? ?Medications: ?Current Outpatient Medications  ?Medication Sig Dispense Refill  ? alendronate (FOSAMAX) 70 MG tablet  Take 70 mg by mouth once a week.    ? aspirin EC 81 MG tablet Take 81 mg by mouth daily.    ? Aspirin-Caffeine (BC FAST PAIN RELIEF) 845-65 MG PACK Take 1 Package by mouth daily as needed (headache). Patient states that she takes a BC 1 to 2 times a week for headache 56 each   ? atorvastatin (LIPITOR) 20 MG tablet Take 10 mg by mouth daily at 6 PM.     ? Cyanocobalamin (B-12) 1000 MCG/ML KIT Inject as directed every 30 (thirty) days.    ? hydroxychloroquine (PLAQUENIL) 200 MG tablet Take 200 mg by mouth 2 (two) times daily. Reported on 03/12/2015    ? metoprolol tartrate (LOPRESSOR) 25 MG tablet TAKE (1) TABLET BY MOUTH TWICE DAILY. 180 tablet 3  ? PRESCRIPTION MEDICATION Patient is getting 2 cc of .05% Marcaine ?1 cc 1000 mcg B12 ?By Dr. Berline Lopes once a month. Foot Block    ? RESTASIS MULTIDOSE 0.05 % ophthalmic emulsion Place 1 drop into both eyes 2 (two) times daily.     ? lansoprazole (PREVACID) 30 MG capsule Take one capsule daily prior to breakfast 90 capsule 3  ? ?No current facility-administered medications for this visit.  ? ? ?Review of Systems: ?GENERAL: negative for malaise, night sweats ?HEENT: No changes in hearing or vision, no nose bleeds or other nasal problems. ?NECK: Negative for lumps, goiter, pain and significant neck swelling ?RESPIRATORY: Negative for cough, wheezing ?CARDIOVASCULAR: Negative for chest pain, leg swelling, palpitations, orthopnea ?GI: SEE HPI ?MUSCULOSKELETAL: Negative for joint pain or swelling, back pain, and muscle pain. ?SKIN: Negative for lesions, rash ?PSYCH: Negative for sleep disturbance, mood disorder and recent psychosocial stressors. ?HEMATOLOGY Negative for prolonged bleeding, bruising easily, and swollen nodes. ?ENDOCRINE: Negative for cold or heat intolerance, polyuria, polydipsia and goiter. ?NEURO: negative for tremor, gait imbalance, syncope and seizures. ?The remainder of the review of systems is noncontributory. ? ? ?Physical Exam: ?BP 133/68 (BP Location: Left  Arm, Patient Position: Sitting, Cuff Size: Large)   Pulse 64   Temp 97.9 ?F (36.6 ?C) (Oral)   Ht $R'5\' 5"'PT$  (1.651 m)   Wt 167 lb 1.6 oz (75.8 kg)   BMI 27.81 kg/m?  ?GENERAL: The patient is AO x3, in no acute distress. ?HEENT: Head is normocephalic and atraumatic. EOMI are intact. Mouth is well hydrated and without lesions. ?NECK: Supple. No masses ?LUNGS: Clear to auscultation. No presence of rhonchi/wheezing/rales. Adequate chest expansion ?HEART: RRR, normal s1 and s2. ?ABDOMEN: Soft, nontender, no guarding, no peritoneal signs, and nondistended. BS +. No masses. ?EXTREMITIES: Without any cyanosis, clubbing, rash, lesions or edema. ?NEUROLOGIC: AOx3, no focal motor deficit. ?SKIN: no jaundice, no rashes ? ?Imaging/Labs: ?as above ? ?I personally reviewed and interpreted the available labs, imaging and endoscopic files. ? ?Impression and Plan: ?LANDRA HOWZE is a 66 y.o. female with past medical history of GERD complicated by short segment Barrett's esophagus, Schatzki's ring, hyperlipidemia, rheumatoid arthritis, who presents for follow up of GERD.  Patient reports that she has had a clear control of her reflux while taking her current dose of Prevacid.  We will continue Prevacid 30 mg daily and will refill her prescription today.  I explained to her that even though she had an EGD 2 years ago, no biopsies were taken and she is due for Barrett's surveillance with repeat endoscopic biopsies.  She he is also due for colonoscopy given her family history of colon cancer (brother).  She would like to schedule schedule this was years later this year and will give [place] a call once she is ready to proceed with this. ? ?- Continue Prevacid 30 mg a day ?-Call back to schedule colonoscopy after the end of April 2023 and esophagogastroduodenospy for surveillance of Barrett's esophagus ? ?All questions were answered.     ? ?Harvel Quale, MD ?Gastroenterology and Hepatology ?[facility] ? ?

## 2021-04-28 NOTE — Patient Instructions (Signed)
Continue Prevacid 30 mg a day ?Please call back to schedule your colonoscopy after the end of April 2023 and esophagogastroduodenospy for surveillance of Barrett's esophagus ?

## 2021-05-15 ENCOUNTER — Encounter (INDEPENDENT_AMBULATORY_CARE_PROVIDER_SITE_OTHER): Payer: Self-pay | Admitting: Gastroenterology

## 2021-05-15 ENCOUNTER — Ambulatory Visit (INDEPENDENT_AMBULATORY_CARE_PROVIDER_SITE_OTHER): Payer: Medicare Other | Admitting: Gastroenterology

## 2021-05-15 ENCOUNTER — Encounter (INDEPENDENT_AMBULATORY_CARE_PROVIDER_SITE_OTHER): Payer: Self-pay

## 2021-05-15 ENCOUNTER — Other Ambulatory Visit (INDEPENDENT_AMBULATORY_CARE_PROVIDER_SITE_OTHER): Payer: Self-pay

## 2021-05-15 VITALS — BP 122/80 | HR 69 | Temp 98.7°F | Ht 65.0 in | Wt 164.1 lb

## 2021-05-15 DIAGNOSIS — K644 Residual hemorrhoidal skin tags: Secondary | ICD-10-CM | POA: Insufficient documentation

## 2021-05-15 DIAGNOSIS — K625 Hemorrhage of anus and rectum: Secondary | ICD-10-CM | POA: Diagnosis not present

## 2021-05-15 DIAGNOSIS — K5900 Constipation, unspecified: Secondary | ICD-10-CM

## 2021-05-15 DIAGNOSIS — Z800 Family history of malignant neoplasm of digestive organs: Secondary | ICD-10-CM | POA: Diagnosis not present

## 2021-05-15 DIAGNOSIS — K22719 Barrett's esophagus with dysplasia, unspecified: Secondary | ICD-10-CM

## 2021-05-15 MED ORDER — HYDROCORTISONE (PERIANAL) 2.5 % EX CREA: 1.0000 "application " | TOPICAL_CREAM | Freq: Three times a day (TID) | CUTANEOUS | 1 refills | Status: DC

## 2021-05-15 NOTE — Progress Notes (Signed)
? ?Referring Provider: Lemmie Evens, MD ?Primary Care Physician:  Lemmie Evens, MD ?Primary GI Physician: Jenetta Downer ? ?Chief Complaint  ?Patient presents with  ? Rectal Bleeding  ?  Rectal bleeding, constipation. Started march 18th. Feels a knot on rectum, painful. Has been blood in underwear and when wiping. Blood was dark but not bright red. Tried miralax 3 days ago and did have bm 2 days after taking miralax. Taking 1 capful of miralax.   ? ?HPI:   ?Mackenzie Black is a 66 y.o. female with past medical history of  GERD complicated by short segment Barrett's esophagus, Schatzki's ring, hyperlipidemia, rheumatoid arthritis,  ? ?Patient presenting today for rectal bleeding, constipation and rectal pain. ? ?Last seen 04/28/21 for GERD, doing well, reported infrequent mild dysphagia with dry foods, taking prevacid 60m daily. Having intermittent constipation at that time with a small amount of blood passed during these episodes. Advised to repeat colonoscopy and upper endoscopy at the end of April 2023.  ? ?Today, she states that she has been more constipated recently. She is having to strain a lot to have a BM. She states that 3/17 was the last time she had had a BM and she noticed the next day that she had a "knot" near her rectum that she felt was quite large, however, it seemed to be decreasing in size over the next week. She had a few more BMs that week, the following Sunday she was sitting with her legs under her working on a puzzle. She reports that she went to the restroom she had some blood in her underwear. She states that since then, she has noticed a small spot of blood with wiping over the past few days, denies any blood mixed in with stools or in the toilet. She started miralax on Monday, had 2 BMs yesterday and again this morning and feels that stools are much softer now and easier to pass. She reports that she has had history of constipation since she was younger with occasional BRB on toilet  tissue at times of worse constipation. She reports that she had some pain in her rectum last weekend when the knot was larger. Pain decreased as lesion became smaller. Is unaware of hx of hemorrhoids ? ?Denies any abdominal pain, melena, nausea, vomiting, or weight loss. States she has started eating less since she has been constipated.  ? ?Last EGD: 10/14/2018, there were changes consistent with Barrett's esophagus in the distal esophagus (not biopsied), low-grade Schatzki ring, MVenia Minkswas attempted to be passed but it could not be advanced, a through-the-scope dilator was insufflated up to 19 mmHg, there was presence of 4 cm hiatal hernia, normal stomach and duodenum. ?Last Colonoscopy: 06/12/2016, diverticulosis ? ?Recommendations:  ?Due for repeat colonoscopy at end of April 2023, should also have repeat EGD at that time as well ? ?Past Medical History:  ?Diagnosis Date  ? Back pain   ? GERD (gastroesophageal reflux disease)   ? Heart murmur   ? Hypercholesteremia   ? Neuropathy   ? [place]   ? Seasonal allergies   ? ? ?Past Surgical History:  ?Procedure Laterality Date  ? Bilateral foot surgery    ? COLONOSCOPY  03/31/2011  ? Procedure: COLONOSCOPY;  Surgeon: MJamesetta So MD;  [facility];  Service: Gastroenterology;  Laterality: N/A;  ? COLONOSCOPY N/A 06/12/2016  ? Procedure: COLONOSCOPY;  Surgeon: NRogene Houston MD;  [facility];  Service: Endoscopy;  Laterality: N/A;  1205-moved to 1035  ? ESOPHAGEAL DILATION N/A 07/12/2015  ? Procedure: ESOPHAGEAL DILATION;  Surgeon: Rogene Houston, MD;  [facility];  Service: Endoscopy;  Laterality: N/A;  ? ESOPHAGEAL DILATION N/A 10/14/2018  ? Procedure: ESOPHAGEAL DILATION;  Surgeon: Rogene Houston, MD;  [facility];  Service: Endoscopy;  Laterality: N/A;  ? ESOPHAGOGASTRODUODENOSCOPY N/A 07/12/2015  ? Procedure: ESOPHAGOGASTRODUODENOSCOPY (EGD);  Surgeon: Rogene Houston, MD;  [facility];  Service: Endoscopy;  Laterality: N/A;  250-moved to 1245 Ann to notify pt  ? ESOPHAGOGASTRODUODENOSCOPY (EGD) WITH PROPOFOL N/A 10/14/2018  ? Procedure: ESOPHAGOGASTRODUODENOSCOPY (EGD) WITH PROPOFOL;  Surgeon: Rogene Houston, MD;  [facility];  Service: Endoscopy;  Laterality: N/A;  855  ? excision of cyst of hand    ? KNEE SURGERY    ? TONSILLECTOMY    ? TUBAL LIGATION    ? ? ?Current Outpatient Medications  ?Medication Sig Dispense Refill  ? alendronate (FOSAMAX) 70 MG tablet Take 70 mg by mouth once a week.    ? aspirin EC 81 MG tablet Take 81 mg by mouth daily.    ? Aspirin-Caffeine (BC FAST PAIN RELIEF) 845-65 MG PACK Take 1 Package by mouth daily as needed (headache). Patient states that she takes a BC 1 to 2 times a week for headache 56 each   ? atorvastatin (LIPITOR) 20 MG tablet Take 10 mg by mouth daily at 6 PM.     ? Cyanocobalamin (B-12) 1000 MCG/ML KIT Inject as directed every 30 (thirty) days.    ? hydroxychloroquine (PLAQUENIL) 200 MG tablet Take 200 mg by mouth 2 (two) times daily. Reported on 03/12/2015    ? lansoprazole (PREVACID) 30 MG capsule Take one capsule daily prior to breakfast 90 capsule 3  ? metoprolol tartrate (LOPRESSOR) 25 MG tablet TAKE (1) TABLET BY MOUTH TWICE DAILY. 180 tablet 3  ? PRESCRIPTION MEDICATION Patient is getting 2 cc of .05% Marcaine ?1 cc 1000 mcg B12 ?By Dr. Berline Lopes once a month. Foot Block    ? RESTASIS MULTIDOSE 0.05 % ophthalmic emulsion Place 1 drop into both eyes 2 (two) times daily.     ? ?No current facility-administered medications for this visit.  ? ? ?Allergies as of 05/15/2021 - Review Complete 05/15/2021  ?Allergen Reaction Noted  ? Humira [adalimumab]  03/01/2013  ? ? ?Family History  ?Problem Relation Age of Onset  ? Colon cancer Sister   ? Colon cancer Brother   ? Stroke Mother   ? Cancer - Lung Father   ? ? ?Social History  ? ?Socioeconomic History  ? Marital status: Single  ?  Spouse name: Not on file  ? Number of children: Not on  file  ? Years of education: Not on file  ? Highest education level: Not on file  ?Occupational History  ? Not on file  ?Tobacco Use  ? Smoking status: Former  ?  Packs/day: 0.25  ?  Years: 2.00  ?  Pack years: 0.50  ?  Types: Cigarettes  ?  Passive exposure: Never  ? Smokeless tobacco: Never  ?Vaping Use  ? Vaping Use: Never used  ?Substance and Sexual Activity  ? Alcohol use: No  ?  Alcohol/week: 0.0 standard drinks  ? Drug use: No  ? Sexual activity: Not on file  ?Other Topics Concern  ? Not on file  ?Social History Narrative  ? Not on file  ? ?Social Determinants of Health  ? ?Emergency planning/management officer  Strain: Not on file  ?Food Insecurity: Not on file  ?Transportation Needs: Not on file  ?Physical Activity: Not on file  ?Stress: Not on file  ?Social Connections: Not on file  ? ?Review of systems ?General: negative for malaise, night sweats, fever, chills, weight loss ?Neck: Negative for lumps, goiter, pain and significant neck swelling ?Resp: Negative for cough, wheezing, dyspnea at rest ?CV: Negative for chest pain, leg swelling, palpitations, orthopnea ?GI: denies melena, nausea, vomiting, diarrhea,  dysphagia, odyonophagia, early satiety or unintentional weight loss. +constipation +toilet tissue hematochezia +painful lesion in rectum ?MSK: Negative for joint pain or swelling, back pain, and muscle pain. ?Derm: Negative for itching or rash ?Psych: Denies depression, anxiety, memory loss, confusion. No homicidal or suicidal ideation.  ?Heme: Negative for prolonged bleeding, bruising easily, and swollen nodes. ?Endocrine: Negative for cold or heat intolerance, polyuria, polydipsia and goiter. ?Neuro: negative for tremor, gait imbalance, syncope and seizures. ?The remainder of the review of systems is noncontributory. ? ?Physical Exam: ?BP 122/80 (BP Location: Left Arm, Patient Position: Sitting, Cuff Size: Large)   Pulse 69   Temp 98.7 ?F (37.1 ?C) (Oral)   Ht 5' 5"  (1.651 m)   Wt 164 lb 1.6 oz (74.4 kg)   BMI  27.31 kg/m?  ?General:   Alert and oriented. No distress noted. Pleasant and cooperative.  ?Head:  Normocephalic and atraumatic. ?Eyes:  Conjuctiva clear without scleral icterus. ?Mouth:  Oral mucosa pink and mo

## 2021-05-15 NOTE — Patient Instructions (Addendum)
Start taking Miralax 1 capful every day for one week. If bowel movements do not improve, increase to 1 capful every 12 hours. If after two weeks there is no improvement, increase to 1 capful every 8 hours ? ?Avoid straining and limit toilet time to no more than 5-10 minutes ? ?Make sure you are staying well hydrated with plenty of water and eating diet high in fruits and veggies, especially kiwi and prunes as these are good for constipation. ? ?I have sent anusol cream for you to apply to hemorrhoid in rectal area three times per day x10 days then as needed after that. ?You may also use over the counter tucks pads to help soothe that area ? ?We will get you scheduled for repeat colonoscopy and EGD as you are due for these at the end of April ? ?Please let me know if symptoms do not improve ?

## 2021-05-20 ENCOUNTER — Telehealth (INDEPENDENT_AMBULATORY_CARE_PROVIDER_SITE_OTHER): Payer: Self-pay | Admitting: *Deleted

## 2021-05-20 NOTE — Telephone Encounter (Signed)
Pt seen 3/30. States she started taking miralax one capful before visit on that Monday and is taking daily. Last Friday she got a head cold and and started having loose watery stools on sat, sun, Monday and today had 3 watery stools like diarrhea. She is not sure if she should cut back on miralax or if she has a stomach bug she pick up with the head cold. ?

## 2021-05-21 NOTE — Telephone Encounter (Signed)
Called and discussed with patient and she states her stool today was more soft than liquid and she has not started any new meds. She understanding to stop miralax while having diarrhea then restart. And she will call back if any issues.  ?

## 2021-05-22 NOTE — Telephone Encounter (Signed)
Discussed with patient. Pt verbalized understanding and she states her stools are back to normal now. ?

## 2021-07-08 ENCOUNTER — Telehealth (INDEPENDENT_AMBULATORY_CARE_PROVIDER_SITE_OTHER): Payer: Self-pay

## 2021-07-08 MED ORDER — PEG 3350-KCL-NA BICARB-NACL 420 G PO SOLR: 4000.0000 mL | ORAL | 0 refills | Status: DC

## 2021-07-08 NOTE — Telephone Encounter (Signed)
Christella App Ann Lattie Riege, CMA  ?

## 2021-07-31 ENCOUNTER — Ambulatory Visit (HOSPITAL_BASED_OUTPATIENT_CLINIC_OR_DEPARTMENT_OTHER): Payer: Medicare Other | Admitting: Anesthesiology

## 2021-07-31 ENCOUNTER — Other Ambulatory Visit: Payer: Self-pay

## 2021-07-31 ENCOUNTER — Ambulatory Visit (HOSPITAL_COMMUNITY)
Admission: RE | Admit: 2021-07-31 | Discharge: 2021-07-31 | Disposition: A | Discharge status: Home / Self Care | Payer: Medicare Other | Attending: Internal Medicine | Admitting: Internal Medicine

## 2021-07-31 ENCOUNTER — Ambulatory Visit (HOSPITAL_COMMUNITY): Payer: Medicare Other | Admitting: Anesthesiology

## 2021-07-31 ENCOUNTER — Encounter (HOSPITAL_COMMUNITY): Payer: Self-pay | Admitting: Internal Medicine

## 2021-07-31 ENCOUNTER — Encounter (HOSPITAL_COMMUNITY)
Admission: RE | Disposition: A | Discharge status: Home / Self Care | Payer: Self-pay | Source: Home / Self Care | Attending: Internal Medicine

## 2021-07-31 DIAGNOSIS — Z87891 Personal history of nicotine dependence: Secondary | ICD-10-CM | POA: Insufficient documentation

## 2021-07-31 DIAGNOSIS — K449 Diaphragmatic hernia without obstruction or gangrene: Secondary | ICD-10-CM

## 2021-07-31 DIAGNOSIS — I10 Essential (primary) hypertension: Secondary | ICD-10-CM | POA: Diagnosis not present

## 2021-07-31 DIAGNOSIS — K6289 Other specified diseases of anus and rectum: Secondary | ICD-10-CM | POA: Insufficient documentation

## 2021-07-31 DIAGNOSIS — D122 Benign neoplasm of ascending colon: Secondary | ICD-10-CM | POA: Insufficient documentation

## 2021-07-31 DIAGNOSIS — K3189 Other diseases of stomach and duodenum: Secondary | ICD-10-CM

## 2021-07-31 DIAGNOSIS — Z1211 Encounter for screening for malignant neoplasm of colon: Secondary | ICD-10-CM | POA: Diagnosis present

## 2021-07-31 DIAGNOSIS — K2270 Barrett's esophagus without dysplasia: Secondary | ICD-10-CM | POA: Insufficient documentation

## 2021-07-31 DIAGNOSIS — Z7982 Long term (current) use of aspirin: Secondary | ICD-10-CM | POA: Insufficient documentation

## 2021-07-31 DIAGNOSIS — K219 Gastro-esophageal reflux disease without esophagitis: Secondary | ICD-10-CM | POA: Insufficient documentation

## 2021-07-31 DIAGNOSIS — K5730 Diverticulosis of large intestine without perforation or abscess without bleeding: Secondary | ICD-10-CM | POA: Insufficient documentation

## 2021-07-31 DIAGNOSIS — Z800 Family history of malignant neoplasm of digestive organs: Secondary | ICD-10-CM

## 2021-07-31 DIAGNOSIS — K635 Polyp of colon: Secondary | ICD-10-CM

## 2021-07-31 DIAGNOSIS — K22719 Barrett's esophagus with dysplasia, unspecified: Secondary | ICD-10-CM

## 2021-07-31 LAB — HM COLONOSCOPY

## 2021-07-31 SURGERY — COLONOSCOPY WITH PROPOFOL
Anesthesia: General

## 2021-07-31 MED ORDER — PROPOFOL 10 MG/ML IV BOLUS
INTRAVENOUS | Status: DC | PRN
  Administered 2021-07-31: 100 mg via INTRAVENOUS

## 2021-07-31 MED ORDER — LACTATED RINGERS IV SOLN: INTRAVENOUS | Status: DC

## 2021-07-31 MED ORDER — PROPOFOL 500 MG/50ML IV EMUL
INTRAVENOUS | Status: DC | PRN
  Administered 2021-07-31: 125 ug/kg/min via INTRAVENOUS

## 2021-07-31 MED ORDER — LIDOCAINE HCL (CARDIAC) PF 100 MG/5ML IV SOSY
PREFILLED_SYRINGE | INTRAVENOUS | Status: DC | PRN
  Administered 2021-07-31: 50 mg via INTRAVENOUS

## 2021-07-31 MED ORDER — STERILE WATER FOR IRRIGATION IR SOLN
Status: DC | PRN
  Administered 2021-07-31: 100 mL

## 2021-07-31 NOTE — H&P (Addendum)
Mackenzie Black is an 66 y.o. female.   Chief Complaint: Patient is here for esophagogastroduodenoscopy and colonoscopy. HPI: Patient is 66 year old Caucasian female who is here for surveillance EGD and high rescreening colonoscopy.  Mackenzie Black has chronic GERD complicated by Barrett's esophagus.  Mackenzie Black says heartburn is well controlled with therapy.  Mackenzie Black denies nausea vomiting or dysphagia.  Mackenzie Black also denies melena.  Her bowels move daily since Mackenzie Black has been taking MiraLAX.  Mackenzie Black states Mackenzie Black would see blood on the tissue when Mackenzie Black was constipated but not anymore.  Mackenzie Black denies abdominal pain or frank rectal bleeding. Family history is significant for colon carcinoma in her sister and her sister was in her 64s at the time of diagnosis and Mackenzie Black is doing fine about 18 years later.  Another sister had 2 polyps/adenomas removed.  Brother died of colon carcinoma in his 7s.  He has Mackenzie Black has Patient is on aspirin which is on hold.  Mackenzie Black does not take anticoagulants.   Past Medical History:  Diagnosis Date   Back pain    GERD (gastroesophageal reflux disease)    Heart murmur    Hypercholesteremia    Neuropathy    RA (rheumatoid arthritis) ([facility])    Seasonal allergies     Past Surgical History:  Procedure Laterality Date   Bilateral foot surgery     COLONOSCOPY  03/31/2011   Procedure: COLONOSCOPY;  Surgeon: Jamesetta So, MD;  [facility];  Service: Gastroenterology;  Laterality: N/A;   COLONOSCOPY N/A 06/12/2016   Procedure: COLONOSCOPY;  Surgeon: Rogene Houston, MD;  [facility];  Service: Endoscopy;  Laterality: N/A;  1205-moved to 1035   ESOPHAGEAL DILATION N/A 07/12/2015   Procedure: ESOPHAGEAL DILATION;  Surgeon: Rogene Houston, MD;  [facility];  Service: Endoscopy;  Laterality: N/A;   ESOPHAGEAL DILATION N/A 10/14/2018   Procedure: ESOPHAGEAL DILATION;  Surgeon: Rogene Houston, MD;  [facility];  Service: Endoscopy;  Laterality: N/A;    ESOPHAGOGASTRODUODENOSCOPY N/A 07/12/2015   Procedure: ESOPHAGOGASTRODUODENOSCOPY (EGD);  Surgeon: Rogene Houston, MD;  [facility];  Service: Endoscopy;  Laterality: N/A;  250-moved to 1245 Ann to notify pt   ESOPHAGOGASTRODUODENOSCOPY (EGD) WITH PROPOFOL N/A 10/14/2018   Procedure: ESOPHAGOGASTRODUODENOSCOPY (EGD) WITH PROPOFOL;  Surgeon: Rogene Houston, MD;  [facility];  Service: Endoscopy;  Laterality: N/A;  855   excision of cyst of hand     KNEE SURGERY     TONSILLECTOMY     TUBAL LIGATION      Family History  Problem Relation Age of Onset   Colon cancer Sister    Colon cancer Brother    Stroke Mother    Cancer - Lung Father    Social History:  reports that Mackenzie Black has quit smoking. Her smoking use included cigarettes. Mackenzie Black has a 0.50 pack-year smoking history. Mackenzie Black has never been exposed to tobacco smoke. Mackenzie Black has never used smokeless tobacco. Mackenzie Black reports that Mackenzie Black does not drink alcohol and does not use drugs.  Allergies:  Allergies  Allergen Reactions   Humira [Adalimumab]     rash    Medications Prior to Admission  Medication Sig Dispense Refill   alendronate (FOSAMAX) 70 MG tablet Take 70 mg by mouth every Tuesday.     aspirin EC 81 MG tablet Take 81 mg by mouth daily.     Aspirin-Caffeine (BC FAST PAIN RELIEF) 845-65 MG PACK Take 1 Package by mouth daily as needed (  headache). Patient states that Mackenzie Black takes a BC 1 to 2 times a week for headache 56 each    atorvastatin (LIPITOR) 20 MG tablet Take 10 mg by mouth daily at 6 PM.      hydroxychloroquine (PLAQUENIL) 200 MG tablet Take 200 mg by mouth 2 (two) times daily. Reported on 03/12/2015     lansoprazole (PREVACID) 30 MG capsule Take one capsule daily prior to breakfast 90 capsule 3   metoprolol tartrate (LOPRESSOR) 25 MG tablet TAKE (1) TABLET BY MOUTH TWICE DAILY. 180 tablet 3   polyethylene glycol-electrolytes (TRILYTE) 420 g solution Take 4,000 mLs by mouth as directed. 4000 mL 0   RESTASIS MULTIDOSE  0.05 % ophthalmic emulsion Place 1 drop into both eyes 2 (two) times daily.      hydrocortisone (ANUSOL-HC) 2.5 % rectal cream Place 1 application. rectally 3 (three) times daily. Apply cream three times per day x10 days then as needed thereafter (Patient not taking: Reported on 07/09/2021) 60 g 1    No results found for this or any previous visit (from the past 48 hour(s)). No results found.  Review of Systems  Blood pressure 131/71, pulse 76, temperature 97.6 F (36.4 C), temperature source Oral, resp. rate 19, height '5\' 6"'$  (1.676 m), weight 73.5 kg, SpO2 100 %. Physical Exam HENT:     Mouth/Throat:     Mouth: Mucous membranes are moist.     Pharynx: Oropharynx is clear.  Eyes:     General: No scleral icterus.    Conjunctiva/sclera: Conjunctivae normal.  Cardiovascular:     Rate and Rhythm: Normal rate and regular rhythm.     Heart sounds: Normal heart sounds. No murmur heard. Pulmonary:     Effort: Pulmonary effort is normal.     Breath sounds: Normal breath sounds.  Abdominal:     General: There is no distension.     Palpations: Abdomen is soft. There is no mass.     Tenderness: There is no abdominal tenderness.  Musculoskeletal:        General: No swelling.     Cervical back: Neck supple.  Lymphadenopathy:     Cervical: No cervical adenopathy.  Skin:    General: Skin is warm and dry.  Neurological:     Mental Status: Mackenzie Black is alert.      Assessment/Plan  Barrett's esophagus. Family history of colon cancer in 2 first-degree relatives. Surveillance esophagogastroduodenoscopy and high risk screening colonoscopy.  Mackenzie Laser, MD 07/31/2021, 1:34 PM

## 2021-07-31 NOTE — Discharge Instructions (Signed)
Resume aspirin on 08/01/2021 Resume other medications as before High-fiber diet. No driving for 24 hours. Physician will call with biopsy results.

## 2021-07-31 NOTE — Op Note (Signed)
[facility] Patient Name: Mackenzie Black Procedure Date: 07/31/2021 1:14 PM MRN: [id number] Date of Birth: [birth year] Attending MD: Hildred Laser , MD CSN: [id number] Age: 66 Admit Type: Outpatient Procedure:                Colonoscopy Indications:              Screening in patient at increased risk: Colorectal                            cancer in brother 17 or older, Screening in patient                            at increased risk: Colorectal cancer in sister 35                            or older Providers:                Hildred Laser, MD, Lambert Mody, Caprice Kluver Referring MD:             Lemmie Evens, MD Medicines:                Propofol per Anesthesia Complications:            No immediate complications. Estimated Blood Loss:     Estimated blood loss was minimal. Procedure:                Pre-Anesthesia Assessment:                           - Prior to the procedure, a History and Physical                            was performed, and patient medications and                            allergies were reviewed. The patient's tolerance of                            previous anesthesia was also reviewed. The risks                            and benefits of the procedure and the sedation                            options and risks were discussed with the patient.                            All questions were answered, and informed consent                            was obtained. Prior Anticoagulants: The patient has                            taken no previous anticoagulant or antiplatelet  agents except for aspirin. ASA Grade Assessment: II                            - A patient with mild systemic disease. After                            reviewing the risks and benefits, the patient was                            deemed in satisfactory condition to undergo the                            procedure.                           After obtaining  informed consent, the colonoscope                            was passed under direct vision. Throughout the                            procedure, the patient's blood pressure, pulse, and                            oxygen saturations were monitored continuously. The                            [id number]-HQ190L ([id number]) scope was introduced through                            the anus and advanced to the the cecum, identified                            by appendiceal orifice and ileocecal valve. The                            colonoscopy was performed without difficulty. The                            patient tolerated the procedure well. The quality                            of the bowel preparation was adequate. The                            ileocecal valve, appendiceal orifice, and rectum                            were photographed. Scope In: 1:57:24 PM Scope Out: 2:16:45 PM Scope Withdrawal Time: 0 hours 10 minutes 26 seconds  Total Procedure Duration: 0 hours 19 minutes 21 seconds  Findings:      The perianal and digital rectal examinations were normal.      A small polyp was found in the ascending colon. The polyp was flat.  Biopsies were taken with a cold forceps for histology. The pathology       specimen was placed into Bottle Number 2.      A few diverticula were found in the sigmoid colon.      Anal papilla(e) were hypertrophied. Impression:               - One small polyp in the ascending colon. Biopsied.                           - Diverticulosis in the sigmoid colon.                           - Anal papilla(e) were hypertrophied. Moderate Sedation:      Per Anesthesia Care Recommendation:           - Patient has a contact number available for                            emergencies. The signs and symptoms of potential                            delayed complications were discussed with the                            patient. Return to normal activities tomorrow.                             Written discharge instructions were provided to the                            patient.                           - High fiber diet today.                           - Continue present medications.                           - No aspirin, ibuprofen, naproxen, or other                            non-steroidal anti-inflammatory drugs for 1 day.                           - Await pathology results.                           - Repeat colonoscopy in 5 years. Procedure Code(s):        --- Professional ---                           [phone], Colonoscopy, flexible; with biopsy, single                            or multiple Diagnosis Code(s):        --- Professional ---  K63.5, Polyp of colon                           K62.89, Other specified diseases of anus and rectum                           Z80.0, Family history of malignant neoplasm of                            digestive organs                           K57.30, Diverticulosis of large intestine without                            perforation or abscess without bleeding CPT copyright 2019 [employer]. All rights reserved. The codes documented in this report are preliminary and upon coder review may  be revised to meet current compliance requirements. Hildred Laser, MD Hildred Laser, MD 07/31/2021 2:36:10 PM This report has been signed electronically. Number of Addenda: 0

## 2021-07-31 NOTE — Op Note (Signed)
[facility] Patient Name: Mackenzie Black Procedure Date: 07/31/2021 1:14 PM MRN: [id number] Date of Birth: [birth year] Attending MD: Hildred Laser , MD CSN: [id number] Age: 66 Admit Type: Outpatient Procedure:                Upper GI endoscopy Indications:              Barrett's esophagus, Follow-up of Barrett's                            esophagus Providers:                Hildred Laser, MD, Lambert Mody, Caprice Kluver Referring MD:             Lemmie Evens, MD Medicines:                Propofol per Anesthesia Complications:            No immediate complications. Estimated Blood Loss:     Estimated blood loss was minimal. Procedure:                Pre-Anesthesia Assessment:                           - Prior to the procedure, a History and Physical                            was performed, and patient medications and                            allergies were reviewed. The patient's tolerance of                            previous anesthesia was also reviewed. The risks                            and benefits of the procedure and the sedation                            options and risks were discussed with the patient.                            All questions were answered, and informed consent                            was obtained. Prior Anticoagulants: The patient has                            taken no previous anticoagulant or antiplatelet                            agents except for aspirin. ASA Grade Assessment: II                            - A patient with mild systemic disease. After  reviewing the risks and benefits, the patient was                            deemed in satisfactory condition to undergo the                            procedure.                           After obtaining informed consent, the endoscope was                            passed under direct vision. Throughout the                            procedure, the patient's  blood pressure, pulse, and                            oxygen saturations were monitored continuously. The                            GIF-H190 ([id number]) scope was introduced through the                            mouth, and advanced to the second part of duodenum.                            The upper GI endoscopy was accomplished without                            difficulty. The patient tolerated the procedure                            well. Scope In: 1:44:02 PM Scope Out: 1:53:25 PM Total Procedure Duration: 0 hours 9 minutes 23 seconds  Findings:      The hypopharynx was normal.      The proximal esophagus, mid esophagus and distal esophagus were normal.      There were esophageal mucosal changes secondary to established       short-segment Barrett's disease present in the distal esophagus. Mucosa       was biopsied with a cold forceps for histology. The pathology specimen       was placed into Bottle Number 1.      The Z-line was regular and was found 32 cm from the incisors.      A 4 cm hiatal hernia was present.      A scar was found in the prepyloric region of the stomach. The scar       tissue was healthy in appearance.      The exam of the stomach was otherwise normal.      The duodenal bulb and second portion of the duodenum were normal. Impression:               - Normal hypopharynx.                           -  Normal proximal esophagus, mid esophagus and                            distal esophagus.                           - Esophageal mucosal changes secondary to                            established short-segment Barrett's disease.                            Biopsied.                           - Z-line regular, 32 cm from the incisors.                           - 4 cm hiatal hernia.                           - Scar in the prepyloric region of the stomach                           comment:very small linear patch of Barretts;                            biopsied.                            . Moderate Sedation:      Per Anesthesia Care Recommendation:           - Patient has a contact number available for                            emergencies. The signs and symptoms of potential                            delayed complications were discussed with the                            patient. Return to normal activities tomorrow.                            Written discharge instructions were provided to the                            patient.                           - Resume previous diet today.                           - Continue present medications.                           - No aspirin, ibuprofen, naproxen, or other  non-steroidal anti-inflammatory drugs for 1 day.                           - Await pathology results. Procedure Code(s):        --- Professional ---                           [phone], Esophagogastroduodenoscopy, flexible,                            transoral; with biopsy, single or multiple Diagnosis Code(s):        --- Professional ---                           K22.70, Barrett's esophagus without dysplasia                           K44.9, Diaphragmatic hernia without obstruction or                            gangrene                           K31.89, Other diseases of stomach and duodenum CPT copyright 2019 [employer]. All rights reserved. The codes documented in this report are preliminary and upon coder review may  be revised to meet current compliance requirements. Hildred Laser, MD Hildred Laser, MD 07/31/2021 2:29:00 PM This report has been signed electronically. Number of Addenda: 0

## 2021-07-31 NOTE — Transfer of Care (Signed)
Immediate Anesthesia Transfer of Care Note  Patient: Mackenzie Black  Procedure(s) Performed: COLONOSCOPY WITH PROPOFOL ESOPHAGOGASTRODUODENOSCOPY (EGD) WITH PROPOFOL BIOPSY POLYPECTOMY  Patient Location: Endoscopy Unit  Anesthesia Type:General  Level of Consciousness: awake  Airway & Oxygen Therapy: Patient Spontanous Breathing  Post-op Assessment: Report given to RN and Post -op Vital signs reviewed and stable  Post vital signs: Reviewed and stable  Last Vitals:  Vitals Value Taken Time  BP    Temp    Pulse 77   Resp 15   SpO2 98%     Last Pain:  Vitals:   07/31/21 1339  TempSrc:   PainSc: 5       Patients Stated Pain Goal: 7 (33/83/29 1916)  Complications: No notable events documented.

## 2021-07-31 NOTE — Anesthesia Preprocedure Evaluation (Signed)
Anesthesia Evaluation  Patient identified by MRN, date of birth, ID band Patient awake    Reviewed: Allergy & Precautions, H&P , NPO status , Patient's Chart, lab work & pertinent test results, reviewed documented beta blocker date and time   Airway Mallampati: II  TM Distance: >3 FB Neck ROM: full    Dental no notable dental hx.    Pulmonary neg pulmonary ROS, former smoker,    Pulmonary exam normal breath sounds clear to auscultation       Cardiovascular Exercise Tolerance: Good hypertension,  Rhythm:regular Rate:Normal     Neuro/Psych  Neuromuscular disease negative psych ROS   GI/Hepatic Neg liver ROS, GERD  Medicated,  Endo/Other  negative endocrine ROS  Renal/GU negative Renal ROS  negative genitourinary   Musculoskeletal   Abdominal   Peds  Hematology negative hematology ROS (+)   Anesthesia Other Findings   Reproductive/Obstetrics negative OB ROS                             Anesthesia Physical Anesthesia Plan  ASA: 2  Anesthesia Plan: General   Post-op Pain Management:    Induction:   PONV Risk Score and Plan: Propofol infusion  Airway Management Planned:   Additional Equipment:   Intra-op Plan:   Post-operative Plan:   Informed Consent: I have reviewed the patients History and Physical, chart, labs and discussed the procedure including the risks, benefits and alternatives for the proposed anesthesia with the patient or authorized representative who has indicated his/her understanding and acceptance.     Dental Advisory Given  Plan Discussed with: CRNA  Anesthesia Plan Comments:         Anesthesia Quick Evaluation

## 2021-08-01 NOTE — Anesthesia Postprocedure Evaluation (Signed)
Anesthesia Post Note  Patient: Mackenzie Black  Procedure(s) Performed: COLONOSCOPY WITH PROPOFOL ESOPHAGOGASTRODUODENOSCOPY (EGD) WITH PROPOFOL BIOPSY POLYPECTOMY  Patient location during evaluation: Phase II Anesthesia Type: General Level of consciousness: awake Pain management: pain level controlled Vital Signs Assessment: post-procedure vital signs reviewed and stable Respiratory status: spontaneous breathing and respiratory function stable Cardiovascular status: blood pressure returned to baseline and stable Postop Assessment: no headache and no apparent nausea or vomiting Anesthetic complications: no Comments: Late entry   No notable events documented.   Last Vitals:  Vitals:   07/31/21 1157 07/31/21 1420  BP: 131/71 (!) 95/48  Pulse: 76 76  Resp: 19 18  Temp: 36.4 C 36.4 C  SpO2: 100% 97%    Last Pain:  Vitals:   07/31/21 1420  TempSrc: Oral  PainSc: 0-No pain                 Louann Sjogren

## 2021-08-05 ENCOUNTER — Encounter (INDEPENDENT_AMBULATORY_CARE_PROVIDER_SITE_OTHER): Payer: Self-pay | Admitting: *Deleted

## 2021-08-05 LAB — SURGICAL PATHOLOGY

## 2021-08-07 ENCOUNTER — Encounter (HOSPITAL_COMMUNITY): Payer: Self-pay | Admitting: Internal Medicine

## 2021-09-01 ENCOUNTER — Ambulatory Visit (INDEPENDENT_AMBULATORY_CARE_PROVIDER_SITE_OTHER): Payer: Medicare Other | Admitting: Cardiology

## 2021-09-01 ENCOUNTER — Encounter: Payer: Self-pay | Admitting: Cardiology

## 2021-09-01 VITALS — BP 138/88 | HR 64 | Ht 65.0 in | Wt 161.0 lb

## 2021-09-01 DIAGNOSIS — R002 Palpitations: Secondary | ICD-10-CM | POA: Diagnosis not present

## 2021-09-01 DIAGNOSIS — R030 Elevated blood-pressure reading, without diagnosis of hypertension: Secondary | ICD-10-CM | POA: Diagnosis not present

## 2021-09-01 NOTE — Progress Notes (Signed)
Clinical Summary Mackenzie Black is a 66 y.o.female seen today for follow up of the following medical problems.      1. Palpitations - off and on several years - feelilng of heart jumping. More often occurs with laying down or at rest -several episodes during a week. Variable in duration. No other associated symptoms. - no EtoH. No coffee. Occasoinal tea. Drinks pretty frequent sodas, trying to change to caffeine free. Drinks hot [employer]. Takes bc powders that have caffeine.  - she reports a strong family history of afib.      05/2017 7 day event monitor no arrhythmias    - fluttering at times, infrequent. Lasts just a few seconds.   - no coffee, caffeine sodas, no energy drinks, no EtoH. Does take bc powders prn - compliant with metoprolol but only takes once daily, reports doing fine on this regimen.      2. Elevated bp today 120/80 in June with rheum, bp's at multiple GI appts has been at goal    Past Medical History:  Diagnosis Date   Back pain    GERD (gastroesophageal reflux disease)    Heart murmur    Hypercholesteremia    Neuropathy    RA (rheumatoid arthritis) (HCC)    Seasonal allergies      Allergies  Allergen Reactions   Humira [Adalimumab]     rash     Current Outpatient Medications  Medication Sig Dispense Refill   alendronate (FOSAMAX) 70 MG tablet Take 70 mg by mouth every Tuesday.     aspirin EC 81 MG tablet Take 1 tablet (81 mg total) by mouth daily. 30 tablet 11   Aspirin-Caffeine (BC FAST PAIN RELIEF) 845-65 MG PACK Take 1 Package by mouth daily as needed (headache). Patient states that she takes a BC 1 to 2 times a week for headache 56 each    atorvastatin (LIPITOR) 20 MG tablet Take 10 mg by mouth daily at 6 PM.      hydroxychloroquine (PLAQUENIL) 200 MG tablet Take 200 mg by mouth 2 (two) times daily. Reported on 03/12/2015     lansoprazole (PREVACID) 30 MG capsule Take one capsule daily prior to breakfast 90 capsule 3    metoprolol tartrate (LOPRESSOR) 25 MG tablet TAKE (1) TABLET BY MOUTH TWICE DAILY. 180 tablet 3   RESTASIS MULTIDOSE 0.05 % ophthalmic emulsion Place 1 drop into both eyes 2 (two) times daily.      No current facility-administered medications for this visit.     Past Surgical History:  Procedure Laterality Date   Bilateral foot surgery     BIOPSY  07/31/2021   Procedure: BIOPSY;  Surgeon: Rogene Houston, MD;  Location: [facility];  Service: Endoscopy;;   COLONOSCOPY  03/31/2011   Procedure: COLONOSCOPY;  Surgeon: Jamesetta So, MD;  Location: [facility];  Service: Gastroenterology;  Laterality: N/A;   COLONOSCOPY N/A 06/12/2016   Procedure: COLONOSCOPY;  Surgeon: Rogene Houston, MD;  Location: [facility];  Service: Endoscopy;  Laterality: N/A;  1205-moved to 1035   COLONOSCOPY WITH PROPOFOL N/A 07/31/2021   Procedure: COLONOSCOPY WITH PROPOFOL;  Surgeon: Rogene Houston, MD;  Location: [facility];  Service: Endoscopy;  Laterality: N/A;  815   ESOPHAGEAL DILATION N/A 07/12/2015   Procedure: ESOPHAGEAL DILATION;  Surgeon: Rogene Houston, MD;  [facility]: [facility];  Service: Endoscopy;  Laterality: N/A;   ESOPHAGEAL DILATION N/A 10/14/2018   Procedure: ESOPHAGEAL DILATION;  Surgeon:  Rogene Houston, MD;  [facility];  Service: Endoscopy;  Laterality: N/A;   ESOPHAGOGASTRODUODENOSCOPY N/A 07/12/2015   Procedure: ESOPHAGOGASTRODUODENOSCOPY (EGD);  Surgeon: Rogene Houston, MD;  [facility];  Service: Endoscopy;  Laterality: N/A;  250-moved to 1245 Ann to notify pt   ESOPHAGOGASTRODUODENOSCOPY (EGD) WITH PROPOFOL N/A 10/14/2018   Procedure: ESOPHAGOGASTRODUODENOSCOPY (EGD) WITH PROPOFOL;  Surgeon: Rogene Houston, MD;  [facility];  Service: Endoscopy;  Laterality: N/A;  855   ESOPHAGOGASTRODUODENOSCOPY (EGD) WITH PROPOFOL N/A 07/31/2021   Procedure: ESOPHAGOGASTRODUODENOSCOPY (EGD) WITH PROPOFOL;  Surgeon: Rogene Houston, MD;  [facility];  Service: Endoscopy;  Laterality: N/A;   excision of cyst of hand     KNEE SURGERY     POLYPECTOMY  07/31/2021   Procedure: POLYPECTOMY;  Surgeon: Rogene Houston, MD;  Location: [facility];  Service: Endoscopy;;   TONSILLECTOMY     TUBAL LIGATION       Allergies  Allergen Reactions   Humira [Adalimumab]     rash      Family History  Problem Relation Age of Onset   Colon cancer Sister    Colon cancer Brother    Stroke Mother    Cancer - Lung Father      Social History Mackenzie Black reports that she has quit smoking. Her smoking use included cigarettes. She has a 0.50 pack-year smoking history. She has never been exposed to tobacco smoke. She has never used smokeless tobacco. Mackenzie Black reports no history of alcohol use.   Review of Systems CONSTITUTIONAL: No weight loss, fever, chills, weakness or fatigue.  HEENT: Eyes: No visual loss, blurred vision, double vision or yellow sclerae.No hearing loss, sneezing, congestion, runny nose or sore throat.  SKIN: No rash or itching.  CARDIOVASCULAR: per hpi RESPIRATORY: No shortness of breath, cough or sputum.  GASTROINTESTINAL: No anorexia, nausea, vomiting or diarrhea. No abdominal pain or blood.  GENITOURINARY: No burning on urination, no polyuria NEUROLOGICAL: No headache, dizziness, syncope, paralysis, ataxia, numbness or tingling in the extremities. No change in bowel or bladder control.  MUSCULOSKELETAL: No muscle, back pain, joint pain or stiffness.  LYMPHATICS: No enlarged nodes. No history of splenectomy.  PSYCHIATRIC: No history of depression or anxiety.  ENDOCRINOLOGIC: No reports of sweating, cold or heat intolerance. No polyuria or polydipsia.  Marland Kitchen   Physical Examination Today's Vitals   09/01/21 1119  BP: 138/88  Pulse: 64  SpO2: 98%  Weight: 161 lb (73 kg)  Height: '5\' 5"'$  (1.651 m)   Body mass index is 26.79 kg/m.  Gen: resting comfortably, no acute distress HEENT: no scleral icterus,  pupils equal round and reactive, no palptable cervical adenopathy,  CV: RRR, no m/r/g no jvd Resp: Clear to auscultation bilaterally GI: abdomen is soft, non-tender, non-distended, normal bowel sounds, no hepatosplenomegaly MSK: extremities are warm, no edema.  Skin: warm, no rash Neuro:  no focal deficits Psych: appropriate affect   Diagnostic Studies 03/2019 carotid US IMPRESSION: 1. Minimal atherosclerotic plaque involving the bilateral carotid system resulting in less than 50% stenosis bilaterally. The appearance is similar to study from 2019. 2. Antegrade flow is noted within both vertebral arteries.   05/2017 holter 7 day event monitor Min HR 52, Max HR 117, Avg HR 73 No symptoms reported Available tracings show normal sinus rhythm No significant arrhythmias.     05/2017 echo Study Conclusions   - Left ventricle: The cavity size was normal. Wall thickness was  normal. Systolic function was normal. The estimated ejection    fraction was in the range of 60% to 65%. Wall motion was normal;    there were no regional wall motion abnormalities. Left    ventricular diastolic function parameters were normal.  - Aortic valve: Mildly calcified annulus. Trileaflet.  - Mitral valve: There was trivial regurgitation.  - Right atrium: Central venous pressure (est): 3 mm Hg.  - Atrial septum: No defect or patent foramen ovale was identified.  - Tricuspid valve: There was trivial regurgitation.  - Pulmonary arteries: PA peak pressure: 24 mm Hg (S).  - Pericardium, extracardiac: There was no pericardial effusion.     Assessment and Plan   1. Palpitations - benign cardiac monitor - overall doing well, she prefers taking lopressor just once daily and symptoms are not bothering her. Discussed could change to toprol xl if needed in the future - EKG today shows sinus brady at 59  2. Elevated bp - elevated here, last visits with several providers has been at goal - continue to  monitor at this time   F/u 1 year     Arnoldo Lenis, M.D.

## 2021-09-01 NOTE — Patient Instructions (Signed)
Medication Instructions:  Your physician recommends that you continue on your current medications as directed. Please refer to the Current Medication list given to you today.   Labwork: None  Testing/Procedures: None  Follow-Up: Follow up with Dr. Branch in 1 year.   Any Other Special Instructions Will Be Listed Below (If Applicable).     If you need a refill on your cardiac medications before your next appointment, please call your pharmacy.  

## 2021-10-10 ENCOUNTER — Other Ambulatory Visit (HOSPITAL_COMMUNITY): Payer: Self-pay | Admitting: Family Medicine

## 2021-10-10 DIAGNOSIS — Z780 Asymptomatic menopausal state: Secondary | ICD-10-CM

## 2021-11-13 ENCOUNTER — Other Ambulatory Visit (INDEPENDENT_AMBULATORY_CARE_PROVIDER_SITE_OTHER): Payer: Self-pay | Admitting: Gastroenterology

## 2021-11-13 DIAGNOSIS — K219 Gastro-esophageal reflux disease without esophagitis: Secondary | ICD-10-CM

## 2021-11-13 DIAGNOSIS — K22719 Barrett's esophagus with dysplasia, unspecified: Secondary | ICD-10-CM

## 2021-12-08 ENCOUNTER — Other Ambulatory Visit (HOSPITAL_COMMUNITY): Payer: Self-pay | Admitting: Family Medicine

## 2021-12-08 DIAGNOSIS — Z1231 Encounter for screening mammogram for malignant neoplasm of breast: Secondary | ICD-10-CM

## 2021-12-09 ENCOUNTER — Other Ambulatory Visit (HOSPITAL_COMMUNITY): Payer: Self-pay | Admitting: Family Medicine

## 2021-12-09 DIAGNOSIS — I6529 Occlusion and stenosis of unspecified carotid artery: Secondary | ICD-10-CM

## 2021-12-17 ENCOUNTER — Ambulatory Visit (HOSPITAL_COMMUNITY)
Admission: RE | Admit: 2021-12-17 | Discharge: 2021-12-17 | Disposition: A | Discharge status: Home / Self Care | Payer: Medicare Other | Source: Ambulatory Visit | Attending: Family Medicine | Admitting: Family Medicine

## 2021-12-17 DIAGNOSIS — I6529 Occlusion and stenosis of unspecified carotid artery: Secondary | ICD-10-CM | POA: Diagnosis present

## 2021-12-17 DIAGNOSIS — Z780 Asymptomatic menopausal state: Secondary | ICD-10-CM | POA: Insufficient documentation

## 2022-01-19 ENCOUNTER — Ambulatory Visit (HOSPITAL_COMMUNITY)
Admission: RE | Admit: 2022-01-19 | Discharge: 2022-01-19 | Disposition: A | Discharge status: Home / Self Care | Payer: Medicare Other | Source: Ambulatory Visit | Attending: Family Medicine | Admitting: Family Medicine

## 2022-01-19 DIAGNOSIS — Z1231 Encounter for screening mammogram for malignant neoplasm of breast: Secondary | ICD-10-CM | POA: Diagnosis present

## 2022-03-10 ENCOUNTER — Encounter (INDEPENDENT_AMBULATORY_CARE_PROVIDER_SITE_OTHER): Payer: Self-pay | Admitting: Gastroenterology

## 2022-04-30 ENCOUNTER — Ambulatory Visit (INDEPENDENT_AMBULATORY_CARE_PROVIDER_SITE_OTHER): Payer: Medicare Other | Admitting: Gastroenterology

## 2022-05-12 ENCOUNTER — Ambulatory Visit (INDEPENDENT_AMBULATORY_CARE_PROVIDER_SITE_OTHER): Payer: Medicare Other | Admitting: Gastroenterology

## 2022-08-25 ENCOUNTER — Ambulatory Visit (INDEPENDENT_AMBULATORY_CARE_PROVIDER_SITE_OTHER): Payer: Medicare Other | Admitting: Gastroenterology

## 2022-08-25 ENCOUNTER — Encounter (INDEPENDENT_AMBULATORY_CARE_PROVIDER_SITE_OTHER): Payer: Self-pay | Admitting: Gastroenterology

## 2022-08-25 VITALS — BP 154/83 | HR 84 | Ht 65.0 in | Wt 159.1 lb

## 2022-08-25 DIAGNOSIS — K219 Gastro-esophageal reflux disease without esophagitis: Secondary | ICD-10-CM

## 2022-08-25 DIAGNOSIS — K22719 Barrett's esophagus with dysplasia, unspecified: Secondary | ICD-10-CM

## 2022-08-25 MED ORDER — LANSOPRAZOLE 30 MG PO CPDR: DELAYED_RELEASE_CAPSULE | ORAL | 3 refills | Status: DC

## 2022-08-25 NOTE — Progress Notes (Addendum)
Referring Provider: No ref. provider found Primary Care Physician:  Benita Stabile, MD Primary GI Physician: Levon Hedger   Chief Complaint  Patient presents with   Gastroesophageal Reflux    Follow up on GERD. Takes lansoprazole 30mg  in the mornings. Would like a refill.    HPI:   Mackenzie Black is a 67 y.o. female with past medical history of GERD complicated by short segment Barrett's esophagus, Schatzki's ring, hyperlipidemia, rheumatoid arthritis   Patient presenting today for follow up of GERD  Last seen March 2023, at that time hving constipation and a knot in her rectum, some rectal bleeding.   Recommended to schedule EGD/Colonoscopy, start miralax, anusol cream, sitz bath  Present: She feels that GERD is well controlled on prevacid 30mg  daily. No breakthrough symptoms. Denies abdominal pain, nausea, vomiting, no dysphagia or odynophagia. She does have some concerns about remaining on PPI therapy due to potential side effects.   She notes that hemorrhoids cleared up after last visit. She will note occasional discomfort in rectal area if she sits for a long time, she is doing miralax and notes that BMs are soft. She will use hemorrhoid cream or tucks pads if she feels pain from her hemorrhoids. No rectal bleeding or melena.    Last EGD:  07/2021- Normal hypopharynx.                           - Normal proximal esophagus, mid esophagus and                            distal esophagus.                           - Esophageal mucosal changes secondary to                            established short-segment Barrett's disease.                            Biopsied. (BE with no dysplasia)                           - Z-line regular, 32 cm from the incisors.                           - 4 cm hiatal hernia.                           - Scar in the prepyloric region of the stomach                           comment:very small linear patch of Barretts;                            biopsied. Last  Colonoscopy:-07/2021 One small polyp in the ascending colon. Biopsied ([facility])                           - Diverticulosis in the sigmoid colon.                           -  Anal papilla(e) were hypertrophied.    Recommendations:  Repeat colonoscopy and EGD 5 years   Past Medical History:  Diagnosis Date   Back pain    GERD (gastroesophageal reflux disease)    Heart murmur    Hypercholesteremia    Neuropathy    RA (rheumatoid arthritis) (HCC)    Seasonal allergies     Past Surgical History:  Procedure Laterality Date   Bilateral foot surgery     BIOPSY  07/31/2021   Procedure: BIOPSY;  Surgeon: Malissa Hippo, MD;  [facility];  Service: Endoscopy;;   COLONOSCOPY  03/31/2011   Procedure: COLONOSCOPY;  Surgeon: Dalia Heading, MD;  [facility];  Service: Gastroenterology;  Laterality: N/A;   COLONOSCOPY N/A 06/12/2016   Procedure: COLONOSCOPY;  Surgeon: Malissa Hippo, MD;  [facility];  Service: Endoscopy;  Laterality: N/A;  1205-moved to 1035   COLONOSCOPY WITH PROPOFOL N/A 07/31/2021   Procedure: COLONOSCOPY WITH PROPOFOL;  Surgeon: Malissa Hippo, MD;  [facility];  Service: Endoscopy;  Laterality: N/A;  815   ESOPHAGEAL DILATION N/A 07/12/2015   Procedure: ESOPHAGEAL DILATION;  Surgeon: Malissa Hippo, MD;  [facility];  Service: Endoscopy;  Laterality: N/A;   ESOPHAGEAL DILATION N/A 10/14/2018   Procedure: ESOPHAGEAL DILATION;  Surgeon: Malissa Hippo, MD;  [facility];  Service: Endoscopy;  Laterality: N/A;   ESOPHAGOGASTRODUODENOSCOPY N/A 07/12/2015   Procedure: ESOPHAGOGASTRODUODENOSCOPY (EGD);  Surgeon: Malissa Hippo, MD;  [facility];  Service: Endoscopy;  Laterality: N/A;  250-moved to 1245 Ann to notify pt   ESOPHAGOGASTRODUODENOSCOPY (EGD) WITH PROPOFOL N/A 10/14/2018   Procedure: ESOPHAGOGASTRODUODENOSCOPY (EGD) WITH PROPOFOL;  Surgeon: Malissa Hippo, MD;  [facility];   Service: Endoscopy;  Laterality: N/A;  855   ESOPHAGOGASTRODUODENOSCOPY (EGD) WITH PROPOFOL N/A 07/31/2021   Procedure: ESOPHAGOGASTRODUODENOSCOPY (EGD) WITH PROPOFOL;  Surgeon: Malissa Hippo, MD;  [facility];  Service: Endoscopy;  Laterality: N/A;   excision of cyst of hand     KNEE SURGERY     POLYPECTOMY  07/31/2021   Procedure: POLYPECTOMY;  Surgeon: Malissa Hippo, MD;  [facility];  Service: Endoscopy;;   TONSILLECTOMY     TUBAL LIGATION      Current Outpatient Medications  Medication Sig Dispense Refill   alendronate (FOSAMAX) 70 MG tablet Take 70 mg by mouth every Tuesday.     aspirin EC 81 MG tablet Take 1 tablet (81 mg total) by mouth daily. 30 tablet 11   Aspirin-Caffeine (BC FAST PAIN RELIEF) 845-65 MG PACK Take 1 Package by mouth daily as needed (headache). Patient states that she takes a BC 1 to 2 times a week for headache 56 each    atorvastatin (LIPITOR) 20 MG tablet Take 10 mg by mouth daily at 6 PM.      hydroxychloroquine (PLAQUENIL) 200 MG tablet Take 200 mg by mouth 2 (two) times daily. Reported on 03/12/2015     lansoprazole (PREVACID) 30 MG capsule TAKE ONE CAPSULE ORALLY EVERY MORNING. 90 capsule 1   metoprolol tartrate (LOPRESSOR) 25 MG tablet TAKE (1) TABLET BY MOUTH TWICE DAILY. 180 tablet 3   Polyethyl Glycol-Propyl Glycol (SYSTANE OP) Apply to eye. One drop both eyes bid     No current facility-administered medications for this visit.    Allergies as of 08/25/2022 - Review Complete 09/01/2021  Allergen Reaction Noted   Humira [adalimumab]  03/01/2013  Family History  Problem Relation Age of Onset   Colon cancer Sister    Colon cancer Brother    Stroke Mother    Cancer - Lung Father     Social History   Socioeconomic History   Marital status: Single    Spouse name: Not on file   Number of children: Not on file   Years of education: Not on file   Highest education level: Not on file  Occupational History   Not on  file  Tobacco Use   Smoking status: Former    Packs/day: 0.25    Years: 2.00    Additional pack years: 0.00    Total pack years: 0.50    Types: Cigarettes    Passive exposure: Never   Smokeless tobacco: Never  Vaping Use   Vaping Use: Never used  Substance and Sexual Activity   Alcohol use: No    Alcohol/week: 0.0 standard drinks of alcohol   Drug use: No   Sexual activity: Not on file  Other Topics Concern   Not on file  Social History Narrative   Not on file   Social Determinants of Health   Financial Resource Strain: Not on file  Food Insecurity: Not on file  Transportation Needs: Not on file  Physical Activity: Not on file  Stress: Not on file  Social Connections: Not on file    Review of systems General: negative for malaise, night sweats, fever, chills, weight loss Neck: Negative for lumps, goiter, pain and significant neck swelling Resp: Negative for cough, wheezing, dyspnea at rest CV: Negative for chest pain, leg swelling, palpitations, orthopnea GI: denies melena, hematochezia, nausea, vomiting, diarrhea, constipation, dysphagia, odyonophagia, early satiety or unintentional weight loss. +occasional rectal pain/hemorrhoids  MSK: Negative for joint pain or swelling, back pain, and muscle pain. Derm: Negative for itching or rash Psych: Denies depression, anxiety, memory loss, confusion. No homicidal or suicidal ideation.  Heme: Negative for prolonged bleeding, bruising easily, and swollen nodes. Endocrine: Negative for cold or heat intolerance, polyuria, polydipsia and goiter. Neuro: negative for tremor, gait imbalance, syncope and seizures. The remainder of the review of systems is noncontributory.  Physical Exam: BP (!) 154/83   Pulse 84   Ht 5\' 5"  (1.651 m)   Wt 159 lb 1.6 oz (72.2 kg)   BMI 26.48 kg/m  General:   Alert and oriented. No distress noted. Pleasant and cooperative.  Head:  Normocephalic and atraumatic. Eyes:  Conjuctiva clear without  scleral icterus. Mouth:  Oral mucosa pink and moist. Good dentition. No lesions. Heart: Normal rate and rhythm, s1 and s2 heart sounds present.  Lungs: Clear lung sounds in all lobes. Respirations equal and unlabored. Abdomen:  +BS, soft, non-tender and non-distended. No rebound or guarding. No HSM or masses noted. Derm: No palmar erythema or jaundice Msk:  Symmetrical without gross deformities. Normal posture. Extremities:  Without edema. Neurologic:  Alert and  oriented x4 Psych:  Alert and cooperative. Normal mood and affect.  Invalid input(s): "6 MONTHS"   ASSESSMENT: RACHAEL CARR is a 67 y.o. female presenting today for follow up of GERD/Barrett's esophagus   GERD/History of Barrett's esophagus: BE on EGD last year with small segment that was ablated with biopsy, no dysplasia. GERD is well controlled on prevacid 30mg  daily. No breakthrough symptoms. She did inquire about switching back to nexium as she had concerns about possible side effects of prevacid, though I discussed with her that nexium and prevacid are both PPI therapies with the  same general side effect classification. Patient was re-assured regarding PPI use and safety of when appropriate indications in question. Most recent studies on PPI therapy that association of symptoms is not equivalent to causation and overall association with for example osteoporosis is weak and based on observational studies. When PPI use is indicated, it is safe to proceed with therapy and titrate dosing/use based on symptom response. Will continue with prevacid 30mg  daily and good reflux precautions    PLAN:  Continue with prevacid 30mg  daily  2.  Good reflux precautions   All questions were answered, patient verbalized understanding and is in agreement with plan as outlined above.   Follow Up: 1 year   Airabella Barley L. Jeanmarie Hubert, MSN, APRN, AGNP-C Adult-Gerontology Nurse Practitioner [facility]  I have reviewed the note  and agree with the APP's assessment as described in this progress note  Given her history of BE, I would favor continuing a PPI.  Katrinka Blazing, MD Gastroenterology and Hepatology [facility]

## 2022-08-25 NOTE — Patient Instructions (Signed)
We will continue on prevacid 30mg  once daily, refill sent  Be mindful of greasy, spicy, fried, citrus foods, caffeine, carbonated drinks, chocolate and alcohol as these can increase reflux symptoms Stay upright 2-3 hours after eating, prior to lying down and avoid eating late in the evenings.  Follow up 1 year

## 2022-10-30 ENCOUNTER — Ambulatory Visit: Payer: Medicare Other | Admitting: Cardiology

## 2022-12-09 ENCOUNTER — Other Ambulatory Visit (HOSPITAL_COMMUNITY): Payer: Self-pay | Admitting: Internal Medicine

## 2022-12-09 DIAGNOSIS — Z1231 Encounter for screening mammogram for malignant neoplasm of breast: Secondary | ICD-10-CM

## 2023-01-22 ENCOUNTER — Ambulatory Visit (HOSPITAL_COMMUNITY): Payer: Medicare Other

## 2023-01-28 ENCOUNTER — Ambulatory Visit (HOSPITAL_COMMUNITY)
Admission: RE | Admit: 2023-01-28 | Discharge: 2023-01-28 | Disposition: A | Discharge status: Home / Self Care | Payer: Medicare Other | Source: Ambulatory Visit | Attending: Internal Medicine | Admitting: Internal Medicine

## 2023-01-28 ENCOUNTER — Encounter (HOSPITAL_COMMUNITY): Payer: Self-pay

## 2023-01-28 DIAGNOSIS — Z1231 Encounter for screening mammogram for malignant neoplasm of breast: Secondary | ICD-10-CM | POA: Insufficient documentation

## 2023-03-04 ENCOUNTER — Encounter: Payer: Self-pay | Admitting: Cardiology

## 2023-03-04 ENCOUNTER — Ambulatory Visit: Payer: Medicare Other | Attending: Cardiology | Admitting: Cardiology

## 2023-03-04 VITALS — BP 144/82 | HR 78 | Ht 67.0 in | Wt 159.6 lb

## 2023-03-04 DIAGNOSIS — E782 Mixed hyperlipidemia: Secondary | ICD-10-CM | POA: Insufficient documentation

## 2023-03-04 DIAGNOSIS — I10 Essential (primary) hypertension: Secondary | ICD-10-CM | POA: Diagnosis present

## 2023-03-04 DIAGNOSIS — R002 Palpitations: Secondary | ICD-10-CM | POA: Diagnosis not present

## 2023-03-04 MED ORDER — METOPROLOL TARTRATE 25 MG PO TABS: 25.0000 mg | ORAL_TABLET | Freq: Three times a day (TID) | ORAL | 3 refills | Status: DC | PRN

## 2023-03-04 MED ORDER — AMLODIPINE BESYLATE 5 MG PO TABS: 5.0000 mg | ORAL_TABLET | Freq: Every day | ORAL | 3 refills | Status: DC

## 2023-03-04 NOTE — Patient Instructions (Signed)
Medication Instructions:  Your physician has recommended you make the following change in your medication:   -Change Lopressor to 25 mg every 8 hours as needed for palpitations.  -Start Norvasc 5 mg tablet once daily.    *If you need a refill on your cardiac medications before your next appointment, please call your pharmacy*   Lab Work: None If you have labs (blood work) drawn today and your tests are completely normal, you will receive your results only by: MyChart Message (if you have MyChart) OR A paper copy in the mail If you have any lab test that is abnormal or we need to change your treatment, we will call you to review the results.   Testing/Procedures: None   Follow-Up: At [facility], you and your health needs are our priority.  As part of our continuing mission to provide you with exceptional heart care, we have created designated Provider Care Teams.  These Care Teams include your primary Cardiologist (physician) and Advanced Practice Providers (APPs -  Physician Assistants and Nurse Practitioners) who all work together to provide you with the care you need, when you need it.  We recommend signing up for the patient portal called "MyChart".  Sign up information is provided on this After Visit Summary.  MyChart is used to connect with patients for Virtual Visits (Telemedicine).  Patients are able to view lab/test results, encounter notes, upcoming appointments, etc.  Non-urgent messages can be sent to your provider as well.   To learn more about what you can do with MyChart, go to [email or web address].    Your next appointment:   6 month(s)  Provider:   You may see Dina Rich, MD or one of the following Advanced Practice Providers on your designated Care Team:   Randall An, PA-C  Jacolyn Reedy, [place]     Other Instructions Nurse Visit-blood pressure check in 2 weeks.

## 2023-03-04 NOTE — Progress Notes (Signed)
Clinical Summary Ms. Brunell is a 68 y.o.female seen today for follow up of the following medical problems.      1. Palpitations - off and on several years - feelilng of heart jumping. More often occurs with laying down or at rest -several episodes during a week. Variable in duration. No other associated symptoms. - no EtoH. No coffee. Occasoinal tea. Drinks pretty frequent sodas, trying to change to caffeine free. Drinks hot [employer]. Takes bc powders that have caffeine.  - she reports a strong family history of afib.      05/2017 7 day event monitor no arrhythmias  -rare palpitations, lasts just a few seconds. Ran out of her lopressor. She had just been taking once daily as opposed to bid. No significant symptoms even off lopressor.     2. HTN - consistently elevated bp's over last few provider visits, has not previously been diagnosed with HTN but meets criteria.    3. HLD - 12/2022 TC 152 TG 76 HDL 55 LDL 82 - she is on atorvastatin 10mg  daily.    Past Medical History:  Diagnosis Date   Back pain    GERD (gastroesophageal reflux disease)    Heart murmur    Hypercholesteremia    Neuropathy    RA (rheumatoid arthritis) (HCC)    Seasonal allergies      Allergies  Allergen Reactions   Humira [Adalimumab]     rash     Current Outpatient Medications  Medication Sig Dispense Refill   alendronate (FOSAMAX) 70 MG tablet Take 70 mg by mouth every Tuesday.     amLODipine (NORVASC) 5 MG tablet Take 1 tablet (5 mg total) by mouth daily. 90 tablet 3   aspirin EC 81 MG tablet Take 1 tablet (81 mg total) by mouth daily. 30 tablet 11   Aspirin-Caffeine (BC FAST PAIN RELIEF) 845-65 MG PACK Take 1 Package by mouth daily as needed (headache). Patient states that she takes a BC 1 to 2 times a week for headache 56 each    atorvastatin (LIPITOR) 20 MG tablet Take 10 mg by mouth daily at 6 PM.      hydroxychloroquine (PLAQUENIL) 200 MG tablet Take 200 mg by mouth 2  (two) times daily. Reported on 03/12/2015     lansoprazole (PREVACID) 30 MG capsule TAKE ONE CAPSULE ORALLY EVERY MORNING. 90 capsule 3   Polyethyl Glycol-Propyl Glycol (SYSTANE OP) Apply to eye. One drop both eyes bid     polyethylene glycol powder (MIRALAX) 17 GM/SCOOP powder as directed Orally     metoprolol tartrate (LOPRESSOR) 25 MG tablet Take 1 tablet (25 mg total) by mouth every 8 (eight) hours as needed. TAKE (1) TABLET BY MOUTH TWICE DAILY. 270 tablet 3   No current facility-administered medications for this visit.     Past Surgical History:  Procedure Laterality Date   Bilateral foot surgery     BIOPSY  07/31/2021   Procedure: BIOPSY;  Surgeon: Malissa Hippo, MD;  [facility];  Service: Endoscopy;;   COLONOSCOPY  03/31/2011   Procedure: COLONOSCOPY;  Surgeon: Dalia Heading, MD;  [facility];  Service: Gastroenterology;  Laterality: N/A;   COLONOSCOPY N/A 06/12/2016   Procedure: COLONOSCOPY;  Surgeon: Malissa Hippo, MD;  [facility];  Service: Endoscopy;  Laterality: N/A;  1205-moved to 1035   COLONOSCOPY WITH PROPOFOL N/A 07/31/2021   Procedure: COLONOSCOPY WITH PROPOFOL;  Surgeon: Malissa Hippo, MD;  [facility];  Service: Endoscopy;  Laterality: N/A;  815   ESOPHAGEAL DILATION N/A 07/12/2015   Procedure: ESOPHAGEAL DILATION;  Surgeon: Malissa Hippo, MD;  [facility];  Service: Endoscopy;  Laterality: N/A;   ESOPHAGEAL DILATION N/A 10/14/2018   Procedure: ESOPHAGEAL DILATION;  Surgeon: Malissa Hippo, MD;  [facility];  Service: Endoscopy;  Laterality: N/A;   ESOPHAGOGASTRODUODENOSCOPY N/A 07/12/2015   Procedure: ESOPHAGOGASTRODUODENOSCOPY (EGD);  Surgeon: Malissa Hippo, MD;  [facility];  Service: Endoscopy;  Laterality: N/A;  250-moved to 1245 Ann to notify pt   ESOPHAGOGASTRODUODENOSCOPY (EGD) WITH PROPOFOL N/A 10/14/2018   Procedure: ESOPHAGOGASTRODUODENOSCOPY (EGD) WITH PROPOFOL;   Surgeon: Malissa Hippo, MD;  [facility];  Service: Endoscopy;  Laterality: N/A;  855   ESOPHAGOGASTRODUODENOSCOPY (EGD) WITH PROPOFOL N/A 07/31/2021   Procedure: ESOPHAGOGASTRODUODENOSCOPY (EGD) WITH PROPOFOL;  Surgeon: Malissa Hippo, MD;  [facility];  Service: Endoscopy;  Laterality: N/A;   excision of cyst of hand     KNEE SURGERY     POLYPECTOMY  07/31/2021   Procedure: POLYPECTOMY;  Surgeon: Malissa Hippo, MD;  [facility];  Service: Endoscopy;;   TONSILLECTOMY     TUBAL LIGATION       Allergies  Allergen Reactions   Humira [Adalimumab]     rash      Family History  Problem Relation Age of Onset   Colon cancer Sister    Colon cancer Brother    Stroke Mother    Cancer - Lung Father      Social History Ms. Borenstein reports that she has quit smoking. Her smoking use included cigarettes. She has a 0.5 pack-year smoking history. She has never been exposed to tobacco smoke. She has never used smokeless tobacco. Ms. Argueta reports no history of alcohol use.   Review of Systems CONSTITUTIONAL: No weight loss, fever, chills, weakness or fatigue.  HEENT: Eyes: No visual loss, blurred vision, double vision or yellow sclerae.No hearing loss, sneezing, congestion, runny nose or sore throat.  SKIN: No rash or itching.  CARDIOVASCULAR: per hpi RESPIRATORY: No shortness of breath, cough or sputum.  GASTROINTESTINAL: No anorexia, nausea, vomiting or diarrhea. No abdominal pain or blood.  GENITOURINARY: No burning on urination, no polyuria NEUROLOGICAL: No headache, dizziness, syncope, paralysis, ataxia, numbness or tingling in the extremities. No change in bowel or bladder control.  MUSCULOSKELETAL: No muscle, back pain, joint pain or stiffness.  LYMPHATICS: No enlarged nodes. No history of splenectomy.  PSYCHIATRIC: No history of depression or anxiety.  ENDOCRINOLOGIC: No reports of sweating, cold or heat intolerance. No polyuria or  polydipsia.  Marland Kitchen   Physical Examination Today's Vitals   03/04/23 1506 03/04/23 1538  BP: (!) 144/88 (!) 144/82  Pulse: 78   SpO2: 95%   Weight: 159 lb 9.6 oz (72.4 kg)   Height: 5\' 7"  (1.702 m)   PainSc: 0-No pain    Body mass index is 25 kg/m.  Gen: resting comfortably, no acute distress HEENT: no scleral icterus, pupils equal round and reactive, no palptable cervical adenopathy,  CV: RRR, no m/rg, no jvd Resp: Clear to auscultation bilaterally GI: abdomen is soft, non-tender, non-distended, normal bowel sounds, no hepatosplenomegaly MSK: extremities are warm, no edema.  Skin: warm, no rash Neuro:  no focal deficits Psych: appropriate affect   Diagnostic Studies  03/2019 carotid US IMPRESSION: 1. Minimal atherosclerotic plaque involving the bilateral carotid system resulting in less than 50% stenosis  bilaterally. The appearance is similar to study from 2019. 2. Antegrade flow is noted within both vertebral arteries.   05/2017 holter 7 day event monitor Min HR 52, Max HR 117, Avg HR 73 No symptoms reported Available tracings show normal sinus rhythm No significant arrhythmias.     05/2017 echo Study Conclusions   - Left ventricle: The cavity size was normal. Wall thickness was    normal. Systolic function was normal. The estimated ejection    fraction was in the range of 60% to 65%. Wall motion was normal;    there were no regional wall motion abnormalities. Left    ventricular diastolic function parameters were normal.  - Aortic valve: Mildly calcified annulus. Trileaflet.  - Mitral valve: There was trivial regurgitation.  - Right atrium: Central venous pressure (est): 3 mm Hg.  - Atrial septum: No defect or patent foramen ovale was identified.  - Tricuspid valve: There was trivial regurgitation.  - Pulmonary arteries: PA peak pressure: 24 mm Hg (S).  - Pericardium, extracardiac: There was no pericardial effusion.      Assessment and Plan   1.  Palpitations - benign cardiac monitor - overall doing well, ran out of lopressor without any significant recurrent symptoms - will change lopressor Rx to just prn palpitations.  - EKG today shows NSR   2. HTN - bp above goal, start norvasc 5mg  daily - nursing visit 2 weeks for vitals check  3. HLD - at goal, continue current meds     Antoine Poche, M.D.

## 2023-03-05 ENCOUNTER — Telehealth: Payer: Self-pay | Admitting: Cardiology

## 2023-03-05 NOTE — Telephone Encounter (Signed)
This pt needs a Nurse Visit-blood pressure check in 2 weeks. Yesterday after her visit I tried to schedule her. She does not want to come in at the times we have allotted for nurse visits here in [place]. I reached out to [facility] in [place] and she called to pt and asked if she could come in for one of [facility] morning nurse visits. The Pt only wants to come in at 1pm. She has not been scheduled.

## 2023-03-09 NOTE — Telephone Encounter (Signed)
Spoke w/Pt she did not want to be seen in [place]. Spoke w/Lydia and she said to have the pt come in on Feb 5 at 1pm. Called pt and she was agreeable. Pt is scheduled.

## 2023-03-09 NOTE — Telephone Encounter (Signed)
Appt made for 03/24/23 in [place] at 1 pm.

## 2023-03-24 ENCOUNTER — Ambulatory Visit: Payer: Medicare Other | Attending: Cardiology | Admitting: *Deleted

## 2023-03-24 VITALS — BP 148/84 | HR 84 | Ht 67.0 in | Wt 157.8 lb

## 2023-03-24 DIAGNOSIS — I10 Essential (primary) hypertension: Secondary | ICD-10-CM | POA: Diagnosis not present

## 2023-03-24 MED ORDER — AMLODIPINE BESYLATE 10 MG PO TABS: 10.0000 mg | ORAL_TABLET | Freq: Every day | ORAL | 1 refills | Status: DC

## 2023-03-24 NOTE — Progress Notes (Signed)
 Presents for nurse visit to have vitals checked per last office visit.  2. HTN - bp above goal, start norvasc  5mg  daily - nursing visit 2 weeks for vitals check  Medications reviewed. Reports taking all doses of medications without side effects. Denies dizziness, chest pain or sob. Vitals done and sent to provider for review.

## 2023-03-24 NOTE — Progress Notes (Signed)
 Bp too high, please increase norvasc  to 10mg  daily and recheck bp 2 weeks  Letta Raw MD

## 2023-03-24 NOTE — Progress Notes (Signed)
 Patient informed and verbalized understanding of plan.

## 2023-04-05 ENCOUNTER — Encounter (INDEPENDENT_AMBULATORY_CARE_PROVIDER_SITE_OTHER): Payer: Self-pay | Admitting: Gastroenterology

## 2023-04-08 ENCOUNTER — Ambulatory Visit: Payer: Medicare Other

## 2023-04-14 ENCOUNTER — Ambulatory Visit: Payer: Medicare Other | Attending: Cardiology

## 2023-04-14 DIAGNOSIS — I10 Essential (primary) hypertension: Secondary | ICD-10-CM | POA: Diagnosis present

## 2023-04-14 NOTE — Patient Instructions (Signed)
 Medication Instructions:  Your physician recommends that you continue on your current medications as directed. Please refer to the Current Medication list given to you today.   Labwork: None today  Testing/Procedures: None today  Follow-Up: As directed  Any Other Special Instructions Will Be Listed Below (If Applicable).     Your BP was 120/66, excellent !  If you need a refill on your cardiac medications before your next appointment, please call your pharmacy.

## 2023-04-14 NOTE — Progress Notes (Signed)
 Nurse visit for BP check   Started Amlodipine 5 mg daily on 03/24/23    BP 120/66, HR 76   Advised to continue all medications as prescribed.    I will forward to MD for review

## 2023-08-26 ENCOUNTER — Ambulatory Visit (INDEPENDENT_AMBULATORY_CARE_PROVIDER_SITE_OTHER): Payer: Medicare Other | Admitting: Gastroenterology

## 2023-08-31 ENCOUNTER — Encounter (INDEPENDENT_AMBULATORY_CARE_PROVIDER_SITE_OTHER): Payer: Self-pay | Admitting: Gastroenterology

## 2023-08-31 ENCOUNTER — Ambulatory Visit (INDEPENDENT_AMBULATORY_CARE_PROVIDER_SITE_OTHER): Payer: Medicare Other | Admitting: Gastroenterology

## 2023-08-31 VITALS — BP 130/71 | HR 81 | Temp 97.8°F | Ht 67.0 in | Wt 155.2 lb

## 2023-08-31 DIAGNOSIS — K219 Gastro-esophageal reflux disease without esophagitis: Secondary | ICD-10-CM

## 2023-08-31 DIAGNOSIS — K2270 Barrett's esophagus without dysplasia: Secondary | ICD-10-CM | POA: Diagnosis not present

## 2023-08-31 DIAGNOSIS — K22719 Barrett's esophagus with dysplasia, unspecified: Secondary | ICD-10-CM

## 2023-08-31 MED ORDER — LANSOPRAZOLE 30 MG PO CPDR: DELAYED_RELEASE_CAPSULE | ORAL | 3 refills | Status: AC

## 2023-08-31 NOTE — Patient Instructions (Signed)
-  continue prevacid  30mg  daily -please let me know if you have further issues with swallowing -Avoid greasy, spicy, fried, citrus foods, and be mindful that caffeine, carbonated drinks, chocolate and alcohol can increase reflux symptoms Stay upright 2-3 hours after eating, prior to lying down and avoid eating late in the evenings.  Follow up 1 year  It was a pleasure to see you today. I want to create trusting relationships with patients and provide genuine, compassionate, and quality care. I truly value your feedback! please be on the lookout for a survey regarding your visit with me today. I appreciate your input about our visit and your time in completing this!    Tressy Kunzman L. Luvada Salamone, MSN, APRN, AGNP-C Adult-Gerontology Nurse Practitioner [facility] Gastroenterology at [facility]

## 2023-08-31 NOTE — Progress Notes (Signed)
 Referring Provider: Shona Norleen PEDLAR, MD Primary Care Physician:  Shona Norleen PEDLAR, MD Primary GI Physician: Dr. Eartha   Chief Complaint  Patient presents with   Follow-up    Patient here today for a follow up on Barrett's. Patient mentions she had an issue with swallowing her food last week.     HPI:   Mackenzie Black is a 68 y.o. female with past medical history of  GERD complicated by short segment Barrett's esophagus, Schatzki's ring, hyperlipidemia, rheumatoid arthritis   Patient presenting today for:  Follow up of GERD  Last seen July 2024, at that time, GERD well controlled on prevacid  30mg  daily, had concerns about remaining on PPI therapy. Hemorrhoids improved since last visit, some discomfort in rectal area if sitting for long periods of time.   Recommended to continue with prevacid  30mg  daily, good reflux precautions  Present:  GERD doing well on prevacid  30mg  daily. She notes a few days ago, she was very hungry and was eating some dry chicken very fast and noted food passing down a bit slower, she waited a few minutes and drank some liquids and felt food pass. She denies any other issues with dysphagia or foods passing slower. She denies any heartburn or acid regurgitation.   Patient denies melena, hematochezia, nausea, vomiting, diarrhea, constipation, odyonophagia, early satiety or weight loss.   Last EGD:  07/2021- Normal hypopharynx.                           - Normal proximal esophagus, mid esophagus and                            distal esophagus.                           - Esophageal mucosal changes secondary to                            established short-segment Barrett's disease.                            Biopsied. (BE with no dysplasia)                           - Z-line regular, 32 cm from the incisors.                           - 4 cm hiatal hernia.                           - Scar in the prepyloric region of the stomach                            comment:very small linear patch of Barretts;                            biopsied. Last Colonoscopy:-07/2021 One small polyp in the ascending colon. Biopsied (TA)                           - Diverticulosis in the  sigmoid colon.                           - Anal papilla(e) were hypertrophied.   Recommendations:  Repeat colonoscopy and EGD 5 years    Past Medical History:  Diagnosis Date   Back pain    GERD (gastroesophageal reflux disease)    Heart murmur    Hypercholesteremia    Neuropathy    RA (rheumatoid arthritis) (HCC)    Seasonal allergies     Past Surgical History:  Procedure Laterality Date   Bilateral foot surgery     BIOPSY  07/31/2021   Procedure: BIOPSY;  Surgeon: Golda Claudis PENNER, MD;  [facility];  Service: Endoscopy;;   COLONOSCOPY  03/31/2011   Procedure: COLONOSCOPY;  Surgeon: Oneil DELENA Budge, MD;  [facility];  Service: Gastroenterology;  Laterality: N/A;   COLONOSCOPY N/A 06/12/2016   Procedure: COLONOSCOPY;  Surgeon: Claudis PENNER Golda, MD;  [facility];  Service: Endoscopy;  Laterality: N/A;  1205-moved to 1035   COLONOSCOPY WITH PROPOFOL  N/A 07/31/2021   Procedure: COLONOSCOPY WITH PROPOFOL ;  Surgeon: Golda Claudis PENNER, MD;  [facility];  Service: Endoscopy;  Laterality: N/A;  815   ESOPHAGEAL DILATION N/A 07/12/2015   Procedure: ESOPHAGEAL DILATION;  Surgeon: Claudis PENNER Golda, MD;  [facility];  Service: Endoscopy;  Laterality: N/A;   ESOPHAGEAL DILATION N/A 10/14/2018   Procedure: ESOPHAGEAL DILATION;  Surgeon: Golda Claudis PENNER, MD;  [facility];  Service: Endoscopy;  Laterality: N/A;   ESOPHAGOGASTRODUODENOSCOPY N/A 07/12/2015   Procedure: ESOPHAGOGASTRODUODENOSCOPY (EGD);  Surgeon: Claudis PENNER Golda, MD;  [facility];  Service: Endoscopy;  Laterality: N/A;  250-moved to 1245 Ann to notify pt   ESOPHAGOGASTRODUODENOSCOPY (EGD) WITH PROPOFOL  N/A 10/14/2018   Procedure:  ESOPHAGOGASTRODUODENOSCOPY (EGD) WITH PROPOFOL ;  Surgeon: Golda Claudis PENNER, MD;  [facility];  Service: Endoscopy;  Laterality: N/A;  855   ESOPHAGOGASTRODUODENOSCOPY (EGD) WITH PROPOFOL  N/A 07/31/2021   Procedure: ESOPHAGOGASTRODUODENOSCOPY (EGD) WITH PROPOFOL ;  Surgeon: Golda Claudis PENNER, MD;  [facility];  Service: Endoscopy;  Laterality: N/A;   excision of cyst of hand     KNEE SURGERY     POLYPECTOMY  07/31/2021   Procedure: POLYPECTOMY;  Surgeon: Golda Claudis PENNER, MD;  [facility];  Service: Endoscopy;;   TONSILLECTOMY     TUBAL LIGATION      Current Outpatient Medications  Medication Sig Dispense Refill   alendronate (FOSAMAX) 70 MG tablet Take 70 mg by mouth every Tuesday.     amLODipine  (NORVASC ) 10 MG tablet Take 1 tablet (10 mg total) by mouth daily. 90 tablet 1   Aspirin-Caffeine (BC FAST PAIN RELIEF) 845-65 MG PACK Take 1 Package by mouth daily as needed (headache). Patient states that she takes a BC 1 to 2 times a week for headache 56 each    atorvastatin (LIPITOR) 10 MG tablet Take 10 mg by mouth daily.     hydroxychloroquine (PLAQUENIL) 200 MG tablet Take 200 mg by mouth 2 (two) times daily. Reported on 03/12/2015     lansoprazole  (PREVACID ) 30 MG capsule TAKE ONE CAPSULE ORALLY EVERY MORNING. 90 capsule 3   metoprolol  tartrate (LOPRESSOR ) 25 MG tablet Take 25 mg by mouth every 8 (eight) hours as needed.     Polyethyl Glycol-Propyl Glycol (SYSTANE OP) Place 1 drop into both eyes 2 (two) times daily at 10 AM  and 5 PM. One drop both eyes bid     polyethylene glycol powder (MIRALAX) 17 GM/SCOOP powder Take 17 g by mouth daily.     No current facility-administered medications for this visit.    Allergies as of 08/31/2023 - Review Complete 08/31/2023  Allergen Reaction Noted   Humira [adalimumab]  03/01/2013    Social History   Socioeconomic History   Marital status: Single    Spouse name: Not on file   Number of children: Not on file    Years of education: Not on file   Highest education level: Not on file  Occupational History   Not on file  Tobacco Use   Smoking status: Former    Current packs/day: 0.25    Average packs/day: 0.3 packs/day for 2.0 years (0.5 ttl pk-yrs)    Types: Cigarettes    Passive exposure: Never   Smokeless tobacco: Never  Vaping Use   Vaping status: Never Used  Substance and Sexual Activity   Alcohol use: No    Alcohol/week: 0.0 standard drinks of alcohol   Drug use: No   Sexual activity: Not on file  Other Topics Concern   Not on file  Social History Narrative   Not on file   Social Drivers of Health   Financial Resource Strain: Not on file  Food Insecurity: Not on file  Transportation Needs: Not on file  Physical Activity: Not on file  Stress: Not on file  Social Connections: Not on file    Review of systems General: negative for malaise, night sweats, fever, chills, weight loss Neck: Negative for lumps, goiter, pain and significant neck swelling Resp: Negative for cough, wheezing, dyspnea at rest CV: Negative for chest pain, leg swelling, palpitations, orthopnea GI: denies melena, hematochezia, nausea, vomiting, diarrhea, constipation, dysphagia, odyonophagia, early satiety or unintentional weight loss.  The remainder of the review of systems is noncontributory.  Physical Exam: BP 130/71 (BP Location: Right Arm, Patient Position: Sitting, Cuff Size: Normal)   Pulse 81   Temp 97.8 F (36.6 C) (Temporal)   Ht 5' 7 (1.702 m)   Wt 155 lb 3.2 oz (70.4 kg)   BMI 24.31 kg/m  General:   Alert and oriented. No distress noted. Pleasant and cooperative.  Head:  Normocephalic and atraumatic. Eyes:  Conjuctiva clear without scleral icterus. Mouth:  Oral mucosa pink and moist. Good dentition. No lesions. Heart: Normal rate and rhythm, s1 and s2 heart sounds present.  Lungs: Clear lung sounds in all lobes. Respirations equal and unlabored. Abdomen:  +BS, soft, non-tender and  non-distended. No rebound or guarding. No HSM or masses noted. Neurologic:  Alert and  oriented x4 Psych:  Alert and cooperative. Normal mood and affect.  Invalid input(s): 6 MONTHS   ASSESSMENT: Mackenzie Black is a 68 y.o. female presenting today for follow up of GERD with short segment Barrett's esophagus without dysplasia  GERD well managed on prevacid  30mg  daily. Denies breakthrough symptoms. Last EGD in 2023 consistent with short segment BE without dysplasia, recommended repeat EGD in 5 years. She does note one episode of slower to pass food after eating some chicken a few days ago but reports the chicken was quite dry and she was eating very fast. She has had no other issues with dysphagia. Recommend she make me aware if she has recurrent issues with dysphagia as we would need to repeat EGD sooner for further evaluation. Should continue with PPI daily, good reflux precautions.    PLAN:  -continue  prevacid  30mg  daily -good reflux precautions -pt to let me know if she has anymore issues with dysphagia.   All questions were answered, patient verbalized understanding and is in agreement with plan as outlined above.   Follow Up: 1 year   Keionna Kinnaird L. Mariette, MSN, APRN, AGNP-C Adult-Gerontology Nurse Practitioner [facility]  I have reviewed the note and agree with the APP's assessment as described in this progress note  Toribio Fortune, MD Gastroenterology and Hepatology [facility]

## 2023-09-09 ENCOUNTER — Other Ambulatory Visit: Payer: Self-pay | Admitting: Cardiology

## 2023-09-16 ENCOUNTER — Other Ambulatory Visit (HOSPITAL_COMMUNITY): Payer: Self-pay | Admitting: Nurse Practitioner

## 2023-09-16 DIAGNOSIS — Z1231 Encounter for screening mammogram for malignant neoplasm of breast: Secondary | ICD-10-CM

## 2023-09-16 DIAGNOSIS — M810 Age-related osteoporosis without current pathological fracture: Secondary | ICD-10-CM

## 2023-10-20 ENCOUNTER — Ambulatory Visit: Admitting: Cardiology

## 2023-11-04 ENCOUNTER — Other Ambulatory Visit (HOSPITAL_COMMUNITY): Payer: Self-pay | Admitting: Nurse Practitioner

## 2023-11-04 DIAGNOSIS — Z8679 Personal history of other diseases of the circulatory system: Secondary | ICD-10-CM

## 2023-11-09 ENCOUNTER — Ambulatory Visit: Attending: Cardiology | Admitting: Cardiology

## 2023-11-09 ENCOUNTER — Encounter: Payer: Self-pay | Admitting: Cardiology

## 2023-11-09 VITALS — BP 128/70 | HR 74 | Ht 65.0 in | Wt 155.0 lb

## 2023-11-09 DIAGNOSIS — E782 Mixed hyperlipidemia: Secondary | ICD-10-CM | POA: Diagnosis not present

## 2023-11-09 DIAGNOSIS — I10 Essential (primary) hypertension: Secondary | ICD-10-CM | POA: Insufficient documentation

## 2023-11-09 DIAGNOSIS — R002 Palpitations: Secondary | ICD-10-CM | POA: Insufficient documentation

## 2023-11-09 NOTE — Patient Instructions (Signed)

## 2023-11-09 NOTE — Progress Notes (Signed)
 Clinical Summary Mackenzie Black is a 68 y.o.female seen today for follow up of the following medical problems.      1. Palpitations - off and on several years - feelilng of heart jumping. More often occurs with laying down or at rest -several episodes during a week. Variable in duration. No other associated symptoms. - no EtoH. No coffee. Occasoinal tea. Drinks pretty frequent sodas, trying to change to caffeine free. Drinks hot [employer]. Takes bc powders that have caffeine.  - she reports a strong family history of afib.      05/2017 7 day event monitor no arrhythmias   -rare palpitations, lasts just a few seconds. Ran out of her lopressor . She had just been taking once daily as opposed to bid. No significant symptoms even off lopressor .   - no recent palpitataions.  - compliant with meds. Has not needed prn metoprolol .      2. HTN - consistently elevated bp's over last few provider visits, has not previously been diagnosed with HTN but meets criteria.      3. HLD - 12/2022 TC 152 TG 76 HDL 55 LDL 82 - she is on atorvastatin 10mg  daily.   4. Carotid stenosis - minimal plaque from prior US   Past Medical History:  Diagnosis Date   Back pain    GERD (gastroesophageal reflux disease)    Heart murmur    Hypercholesteremia    Neuropathy    RA (rheumatoid arthritis) (HCC)    Seasonal allergies      Allergies  Allergen Reactions   Humira [Adalimumab]     rash     Current Outpatient Medications  Medication Sig Dispense Refill   alendronate (FOSAMAX) 70 MG tablet Take 70 mg by mouth every Tuesday.     amLODipine  (NORVASC ) 10 MG tablet Take 1 tablet (10 mg total) by mouth daily. 90 tablet 3   Aspirin-Caffeine (BC FAST PAIN RELIEF) 845-65 MG PACK Take 1 Package by mouth daily as needed (headache). Patient states that she takes a BC 1 to 2 times a week for headache 56 each    atorvastatin (LIPITOR) 10 MG tablet Take 10 mg by mouth daily.      hydroxychloroquine (PLAQUENIL) 200 MG tablet Take 200 mg by mouth 2 (two) times daily. Reported on 03/12/2015     lansoprazole  (PREVACID ) 30 MG capsule TAKE ONE CAPSULE ORALLY EVERY MORNING. 90 capsule 3   metoprolol  tartrate (LOPRESSOR ) 25 MG tablet Take 25 mg by mouth every 8 (eight) hours as needed.     Polyethyl Glycol-Propyl Glycol (SYSTANE OP) Place 1 drop into both eyes 2 (two) times daily at 10 AM and 5 PM. One drop both eyes bid     polyethylene glycol powder (MIRALAX) 17 GM/SCOOP powder Take 17 g by mouth daily.     No current facility-administered medications for this visit.     Past Surgical History:  Procedure Laterality Date   Bilateral foot surgery     BIOPSY  07/31/2021   Procedure: BIOPSY;  Surgeon: Golda Claudis PENNER, MD;  [facility];  Service: Endoscopy;;   COLONOSCOPY  03/31/2011   Procedure: COLONOSCOPY;  Surgeon: Oneil DELENA Budge, MD;  [facility];  Service: Gastroenterology;  Laterality: N/A;   COLONOSCOPY N/A 06/12/2016   Procedure: COLONOSCOPY;  Surgeon: Claudis PENNER Golda, MD;  [facility];  Service: Endoscopy;  Laterality: N/A;  1205-moved to 1035   COLONOSCOPY WITH PROPOFOL  N/A 07/31/2021   Procedure:  COLONOSCOPY WITH PROPOFOL ;  Surgeon: Golda Claudis PENNER, MD;  [facility];  Service: Endoscopy;  Laterality: N/A;  815   ESOPHAGEAL DILATION N/A 07/12/2015   Procedure: ESOPHAGEAL DILATION;  Surgeon: Claudis PENNER Golda, MD;  [facility];  Service: Endoscopy;  Laterality: N/A;   ESOPHAGEAL DILATION N/A 10/14/2018   Procedure: ESOPHAGEAL DILATION;  Surgeon: Golda Claudis PENNER, MD;  [facility];  Service: Endoscopy;  Laterality: N/A;   ESOPHAGOGASTRODUODENOSCOPY N/A 07/12/2015   Procedure: ESOPHAGOGASTRODUODENOSCOPY (EGD);  Surgeon: Claudis PENNER Golda, MD;  [facility];  Service: Endoscopy;  Laterality: N/A;  250-moved to 1245 Ann to notify pt   ESOPHAGOGASTRODUODENOSCOPY (EGD) WITH PROPOFOL  N/A 10/14/2018    Procedure: ESOPHAGOGASTRODUODENOSCOPY (EGD) WITH PROPOFOL ;  Surgeon: Golda Claudis PENNER, MD;  [facility];  Service: Endoscopy;  Laterality: N/A;  855   ESOPHAGOGASTRODUODENOSCOPY (EGD) WITH PROPOFOL  N/A 07/31/2021   Procedure: ESOPHAGOGASTRODUODENOSCOPY (EGD) WITH PROPOFOL ;  Surgeon: Golda Claudis PENNER, MD;  [facility];  Service: Endoscopy;  Laterality: N/A;   excision of cyst of hand     KNEE SURGERY     POLYPECTOMY  07/31/2021   Procedure: POLYPECTOMY;  Surgeon: Golda Claudis PENNER, MD;  [facility];  Service: Endoscopy;;   TONSILLECTOMY     TUBAL LIGATION       Allergies  Allergen Reactions   Humira [Adalimumab]     rash      Family History  Problem Relation Age of Onset   Stroke Mother    Cancer - Lung Father    Colon cancer Sister    Colon cancer Brother      Social History Mackenzie Black reports that she has quit smoking. Her smoking use included cigarettes. She has a 0.5 pack-year smoking history. She has never been exposed to tobacco smoke. She has never used smokeless tobacco. Mackenzie Black reports no history of alcohol use.     Physical Examination Vitals:   11/09/23 1042 11/09/23 1124  BP: 136/72 128/70  Pulse: 74   SpO2: 98%    Filed Weights   11/09/23 1042  Weight: 155 lb (70.3 kg)    Gen: resting comfortably, no acute distress HEENT: no scleral icterus, pupils equal round and reactive, no palptable cervical adenopathy,  CV: RRR, no m/rg, no jvd Resp: Clear to auscultation bilaterally GI: abdomen is soft, non-tender, non-distended, normal bowel sounds, no hepatosplenomegaly MSK: extremities are warm, no edema.  Skin: warm, no rash Neuro:  no focal deficits Psych: appropriate affect   Diagnostic Studies 03/2019 carotid US  IMPRESSION: 1. Minimal atherosclerotic plaque involving the bilateral carotid system resulting in less than 50% stenosis bilaterally. The appearance is similar to study from 2019. 2. Antegrade flow is  noted within both vertebral arteries.   05/2017 holter 7 day event monitor Min HR 52, Max HR 117, Avg HR 73 No symptoms reported Available tracings show normal sinus rhythm No significant arrhythmias.     05/2017 echo Study Conclusions   - Left ventricle: The cavity size was normal. Wall thickness was    normal. Systolic function was normal. The estimated ejection    fraction was in the range of 60% to 65%. Wall motion was normal;    there were no regional wall motion abnormalities. Left    ventricular diastolic function parameters were normal.  - Aortic valve: Mildly calcified annulus. Trileaflet.  - Mitral valve: There was trivial regurgitation.  - Right atrium: Central venous pressure (est): 3 mm Hg.  -  Atrial septum: No defect or patent foramen ovale was identified.  - Tricuspid valve: There was trivial regurgitation.  - Pulmonary arteries: PA peak pressure: 24 mm Hg (S).  - Pericardium, extracardiac: There was no pericardial effusion.     Assessment and Plan  1. Palpitations - benign cardiac monitor - no recent symptoms, continue prn lopressor    2. HTN - bp is at goal, continue current meds    3. HLD - lipids are at goal, continue current meds  F/u 1 year      Dorn PHEBE Ross, M.D.

## 2023-12-20 ENCOUNTER — Ambulatory Visit (HOSPITAL_COMMUNITY)
Admission: RE | Admit: 2023-12-20 | Discharge: 2023-12-20 | Disposition: A | Discharge status: Home / Self Care | Source: Ambulatory Visit | Attending: Nurse Practitioner | Admitting: Nurse Practitioner

## 2023-12-20 DIAGNOSIS — Z8679 Personal history of other diseases of the circulatory system: Secondary | ICD-10-CM

## 2023-12-20 DIAGNOSIS — M810 Age-related osteoporosis without current pathological fracture: Secondary | ICD-10-CM | POA: Insufficient documentation

## 2023-12-23 ENCOUNTER — Encounter (INDEPENDENT_AMBULATORY_CARE_PROVIDER_SITE_OTHER): Payer: Self-pay | Admitting: Gastroenterology

## 2024-01-31 ENCOUNTER — Inpatient Hospital Stay (HOSPITAL_COMMUNITY)
Admission: RE | Admit: 2024-01-31 | Discharge: 2024-01-31 | Attending: Nurse Practitioner | Admitting: Nurse Practitioner

## 2024-01-31 DIAGNOSIS — Z1231 Encounter for screening mammogram for malignant neoplasm of breast: Secondary | ICD-10-CM | POA: Diagnosis present

## 2024-03-24 ENCOUNTER — Telehealth: Payer: Self-pay | Admitting: Cardiology

## 2024-03-24 NOTE — Telephone Encounter (Signed)
 Patient will take 2-5 mg tablets of amlodipine  until she can get out of her driveway and get to pharmacy.

## 2024-03-24 NOTE — Telephone Encounter (Signed)
 Pt c/o medication issue:  1. Name of Medication: amLODipine  (NORVASC ) 10 MG tablet   2. How are you currently taking this medication (dosage and times per day)? As written  3. Are you having a reaction (difficulty breathing--STAT)? No  4. What is your medication issue? Pt states she's out of the 10 MG tablets and can't leave her house until the weather is better and would like to take 2 5 MG tablets
